# Patient Record
Sex: Female | Born: 1960 | Race: Black or African American | Hispanic: No | Marital: Single | Smoking: Never smoker
Health system: Southern US, Community
[De-identification: ages and names within clinical notes are randomized; demographics above are authoritative.]

## PROBLEM LIST (undated history)

## (undated) DIAGNOSIS — M1990 Unspecified osteoarthritis, unspecified site: Secondary | ICD-10-CM

## (undated) DIAGNOSIS — I10 Essential (primary) hypertension: Secondary | ICD-10-CM

## (undated) HISTORY — PX: ABDOMINAL HYSTERECTOMY: SHX81

---

## 2001-12-19 ENCOUNTER — Encounter: Admission: RE | Admit: 2001-12-19 | Discharge: 2001-12-19 | Payer: Self-pay | Admitting: *Deleted

## 2002-10-12 ENCOUNTER — Encounter: Admission: RE | Admit: 2002-10-12 | Discharge: 2002-10-12 | Payer: Self-pay | Admitting: Family Medicine

## 2002-10-12 ENCOUNTER — Encounter (INDEPENDENT_AMBULATORY_CARE_PROVIDER_SITE_OTHER): Payer: Self-pay | Admitting: Specialist

## 2003-02-22 ENCOUNTER — Encounter (INDEPENDENT_AMBULATORY_CARE_PROVIDER_SITE_OTHER): Payer: Self-pay | Admitting: *Deleted

## 2003-02-22 ENCOUNTER — Encounter (INDEPENDENT_AMBULATORY_CARE_PROVIDER_SITE_OTHER): Payer: Self-pay

## 2003-02-22 ENCOUNTER — Encounter: Admission: RE | Admit: 2003-02-22 | Discharge: 2003-02-22 | Payer: Self-pay | Admitting: Family Medicine

## 2003-02-22 ENCOUNTER — Other Ambulatory Visit: Admission: RE | Admit: 2003-02-22 | Discharge: 2003-02-22 | Payer: Self-pay | Admitting: Obstetrics and Gynecology

## 2003-03-01 ENCOUNTER — Ambulatory Visit (HOSPITAL_COMMUNITY): Admission: RE | Admit: 2003-03-01 | Discharge: 2003-03-01 | Payer: Self-pay | Admitting: Family Medicine

## 2004-01-14 ENCOUNTER — Encounter: Admission: RE | Admit: 2004-01-14 | Discharge: 2004-01-14 | Payer: Self-pay | Admitting: Obstetrics and Gynecology

## 2004-04-14 ENCOUNTER — Ambulatory Visit: Payer: Self-pay | Admitting: Obstetrics and Gynecology

## 2004-05-01 ENCOUNTER — Ambulatory Visit (HOSPITAL_COMMUNITY): Admission: RE | Admit: 2004-05-01 | Discharge: 2004-05-01 | Payer: Self-pay | Admitting: Obstetrics and Gynecology

## 2004-10-07 ENCOUNTER — Ambulatory Visit (HOSPITAL_COMMUNITY): Admission: RE | Admit: 2004-10-07 | Discharge: 2004-10-07 | Payer: Self-pay | Admitting: Obstetrics and Gynecology

## 2004-10-07 ENCOUNTER — Encounter (INDEPENDENT_AMBULATORY_CARE_PROVIDER_SITE_OTHER): Payer: Self-pay | Admitting: Specialist

## 2004-10-07 ENCOUNTER — Ambulatory Visit: Payer: Self-pay | Admitting: Obstetrics and Gynecology

## 2004-10-20 ENCOUNTER — Ambulatory Visit: Payer: Self-pay | Admitting: Obstetrics and Gynecology

## 2004-12-03 ENCOUNTER — Inpatient Hospital Stay (HOSPITAL_COMMUNITY): Admission: RE | Admit: 2004-12-03 | Discharge: 2004-12-05 | Payer: Self-pay | Admitting: Obstetrics and Gynecology

## 2004-12-03 ENCOUNTER — Encounter (INDEPENDENT_AMBULATORY_CARE_PROVIDER_SITE_OTHER): Payer: Self-pay | Admitting: *Deleted

## 2004-12-03 ENCOUNTER — Ambulatory Visit: Payer: Self-pay | Admitting: Obstetrics and Gynecology

## 2004-12-07 ENCOUNTER — Inpatient Hospital Stay (HOSPITAL_COMMUNITY): Admission: AD | Admit: 2004-12-07 | Discharge: 2004-12-07 | Payer: Self-pay | Admitting: *Deleted

## 2004-12-24 ENCOUNTER — Ambulatory Visit: Payer: Self-pay | Admitting: Obstetrics and Gynecology

## 2005-01-12 ENCOUNTER — Ambulatory Visit: Payer: Self-pay | Admitting: Obstetrics and Gynecology

## 2005-07-15 ENCOUNTER — Ambulatory Visit: Payer: Self-pay | Admitting: Obstetrics and Gynecology

## 2005-11-16 ENCOUNTER — Emergency Department (HOSPITAL_COMMUNITY): Admission: EM | Admit: 2005-11-16 | Discharge: 2005-11-16 | Payer: Self-pay | Admitting: Family Medicine

## 2007-05-09 ENCOUNTER — Emergency Department (HOSPITAL_COMMUNITY): Admission: EM | Admit: 2007-05-09 | Discharge: 2007-05-09 | Payer: Self-pay | Admitting: Emergency Medicine

## 2007-12-06 ENCOUNTER — Ambulatory Visit: Payer: Self-pay | Admitting: Obstetrics & Gynecology

## 2007-12-12 ENCOUNTER — Ambulatory Visit (HOSPITAL_COMMUNITY): Admission: RE | Admit: 2007-12-12 | Discharge: 2007-12-12 | Payer: Self-pay | Admitting: Obstetrics and Gynecology

## 2008-04-22 ENCOUNTER — Emergency Department (HOSPITAL_BASED_OUTPATIENT_CLINIC_OR_DEPARTMENT_OTHER): Admission: EM | Admit: 2008-04-22 | Discharge: 2008-04-22 | Payer: Self-pay | Admitting: Emergency Medicine

## 2008-12-11 ENCOUNTER — Ambulatory Visit: Payer: Self-pay | Admitting: Obstetrics and Gynecology

## 2008-12-11 LAB — CONVERTED CEMR LAB
Chlamydia, Swab/Urine, PCR: NEGATIVE
GC Probe Amp, Urine: NEGATIVE

## 2009-02-06 ENCOUNTER — Encounter: Admission: RE | Admit: 2009-02-06 | Discharge: 2009-02-06 | Payer: Self-pay | Admitting: Infectious Diseases

## 2009-04-01 ENCOUNTER — Emergency Department (HOSPITAL_BASED_OUTPATIENT_CLINIC_OR_DEPARTMENT_OTHER): Admission: EM | Admit: 2009-04-01 | Discharge: 2009-04-01 | Payer: Self-pay | Admitting: Emergency Medicine

## 2009-06-16 ENCOUNTER — Ambulatory Visit: Payer: Self-pay | Admitting: Family Medicine

## 2009-06-16 DIAGNOSIS — I10 Essential (primary) hypertension: Secondary | ICD-10-CM

## 2009-06-16 DIAGNOSIS — F17200 Nicotine dependence, unspecified, uncomplicated: Secondary | ICD-10-CM

## 2009-06-16 DIAGNOSIS — E669 Obesity, unspecified: Secondary | ICD-10-CM

## 2009-06-19 ENCOUNTER — Ambulatory Visit: Payer: Self-pay | Admitting: Family Medicine

## 2009-07-15 ENCOUNTER — Telehealth: Payer: Self-pay | Admitting: Family Medicine

## 2009-07-15 DIAGNOSIS — H5310 Unspecified subjective visual disturbances: Secondary | ICD-10-CM | POA: Insufficient documentation

## 2009-07-23 ENCOUNTER — Telehealth (INDEPENDENT_AMBULATORY_CARE_PROVIDER_SITE_OTHER): Payer: Self-pay | Admitting: Family Medicine

## 2009-08-14 ENCOUNTER — Encounter: Payer: Self-pay | Admitting: Family Medicine

## 2009-09-15 ENCOUNTER — Telehealth (INDEPENDENT_AMBULATORY_CARE_PROVIDER_SITE_OTHER): Payer: Self-pay | Admitting: Family Medicine

## 2009-09-20 ENCOUNTER — Ambulatory Visit: Payer: Self-pay | Admitting: Interventional Radiology

## 2009-09-20 ENCOUNTER — Emergency Department (HOSPITAL_BASED_OUTPATIENT_CLINIC_OR_DEPARTMENT_OTHER): Admission: EM | Admit: 2009-09-20 | Discharge: 2009-09-20 | Payer: Self-pay | Admitting: Emergency Medicine

## 2009-10-28 ENCOUNTER — Encounter: Payer: Self-pay | Admitting: Family Medicine

## 2009-10-28 ENCOUNTER — Ambulatory Visit: Payer: Self-pay | Admitting: Family Medicine

## 2009-10-28 DIAGNOSIS — R300 Dysuria: Secondary | ICD-10-CM

## 2009-10-28 LAB — CONVERTED CEMR LAB
Bilirubin Urine: NEGATIVE
Chlamydia, DNA Probe: NEGATIVE
GC Probe Amp, Genital: NEGATIVE
Glucose, Urine, Semiquant: NEGATIVE
Ketones, urine, test strip: NEGATIVE
Nitrite: NEGATIVE
Protein, U semiquant: NEGATIVE
Specific Gravity, Urine: 1.025
Urobilinogen, UA: 0.2
WBC Urine, dipstick: NEGATIVE
Whiff Test: NEGATIVE
pH: 5

## 2009-10-31 ENCOUNTER — Telehealth: Payer: Self-pay | Admitting: Family Medicine

## 2009-10-31 DIAGNOSIS — K029 Dental caries, unspecified: Secondary | ICD-10-CM | POA: Insufficient documentation

## 2009-11-04 ENCOUNTER — Encounter: Payer: Self-pay | Admitting: *Deleted

## 2009-12-10 ENCOUNTER — Ambulatory Visit: Payer: Self-pay | Admitting: Family Medicine

## 2009-12-10 ENCOUNTER — Encounter: Payer: Self-pay | Admitting: Family Medicine

## 2009-12-10 LAB — CONVERTED CEMR LAB
Chlamydia, DNA Probe: NEGATIVE
GC Probe Amp, Genital: NEGATIVE
Pap Smear: NEGATIVE
Whiff Test: NEGATIVE

## 2009-12-12 ENCOUNTER — Encounter: Payer: Self-pay | Admitting: Family Medicine

## 2009-12-12 ENCOUNTER — Ambulatory Visit: Payer: Self-pay | Admitting: Family Medicine

## 2009-12-12 LAB — CONVERTED CEMR LAB
BUN: 9 mg/dL (ref 6–23)
CO2: 26 meq/L (ref 19–32)
Calcium: 9.2 mg/dL (ref 8.4–10.5)
Chloride: 101 meq/L (ref 96–112)
Cholesterol: 170 mg/dL (ref 0–200)
Creatinine, Ser: 0.91 mg/dL (ref 0.40–1.20)
Glucose, Bld: 76 mg/dL (ref 70–99)
HDL: 36 mg/dL — ABNORMAL LOW (ref >39)
LDL Cholesterol: 124 mg/dL — ABNORMAL HIGH (ref 0–99)
Potassium: 4.5 meq/L (ref 3.5–5.3)
Sodium: 138 meq/L (ref 135–145)
Total CHOL/HDL Ratio: 4.7
Triglycerides: 49 mg/dL (ref <150)
VLDL: 10 mg/dL (ref 0–40)

## 2009-12-16 ENCOUNTER — Telehealth: Payer: Self-pay | Admitting: Family Medicine

## 2010-02-13 ENCOUNTER — Ambulatory Visit (HOSPITAL_COMMUNITY): Admission: RE | Admit: 2010-02-13 | Discharge: 2010-02-13 | Payer: Self-pay | Admitting: Family Medicine

## 2010-07-21 NOTE — Progress Notes (Signed)
Summary: phn msg  Phone Note Call from Patient Call back at [facility] Phone [phone]   Caller: Patient Summary of Call: Pt says that [facility] says they sent [place] the voucher for her eye glasses.  Did Dr. Lorelee Market recieve this voucher? Initial call taken by: Clydell Hakim,  September 15, 2009 9:58 AM  Follow-up for Phone Call        no, I have not seen any voucher. I can look in my box tomorrow, but I've not heard of them sending vouchers to [place].  Follow-up by: Jamie Brookes MD,  September 15, 2009 4:19 PM  Additional Follow-up for Phone Call Additional follow up Details #1::        Let pt know we did not have voucher. Additional Follow-up by: Clydell Hakim,  September 17, 2009 11:41 AM

## 2010-07-21 NOTE — Progress Notes (Signed)
Summary: Referral  Phone Note Call from Patient Call back at Home Phone [phone]   Reason for Call: Referral Summary of Call: pt has [employer] coverage and would like a referral to an eye doctor Initial call taken by: Knox Royalty,  July 15, 2009 10:25 AM  Follow-up for Phone Call        to PCP Follow-up by: Gladstone Pih,  July 15, 2009 10:37 AM  Additional Follow-up for Phone Call Additional follow up Details #1::        Pt does not have Diabetes and [employer] does not cover optometry services. If she needs glasses or an eye exam she will have to pay for them herself as far as I know. Do you know any different?  Additional Follow-up by: Jamie Brookes MD,  July 22, 2009 9:04 PM  New Problems: UNSPECIFIED SUBJECTIVE VISUAL DISTURBANCE (ICD-368.10)   Additional Follow-up for Phone Call Additional follow up Details #2::    Called pt to find out about ins, LEFT MESSAGE FOR HER TO CALL BACK.  If she has Jaynee Eagles we can do a [facility] referral for her eye exam.  Follow-up by: Gladstone Pih,  July 23, 2009 9:13 AM  Additional Follow-up for Phone Call Additional follow up Details #3:: Details for Additional Follow-up Action Taken: Spoke with Pt she has Jaynee Eagles and we have a copy of her card.  If you would put the referral in I will fill out the paper work and fax it to [facility].  Explained to pt that this is a voluter set up and unsure how long it will take for them to call her with an appt.  Additional Follow-up by: Gladstone Pih,  July 23, 2009 12:10 PM  New Problems: UNSPECIFIED SUBJECTIVE VISUAL DISTURBANCE (ICD-368.10)

## 2010-07-21 NOTE — Miscellaneous (Signed)
Summary: re: Dental referral/TS  Clinical Lists Changes called pt. pt has to pay out of pocket for cleaning. We do not refer pt's to the Dental Clinic w/out problems.Arlyss Repress CMA,  Nov 04, 2009 12:01 PM

## 2010-07-21 NOTE — Miscellaneous (Signed)
Summary: walk in: wants STD check  Clinical Lists Changes walk in asking for std screen including labs for HIV & syphlis. she is in a new relationship & her partner is being treated for something. they had sex saturday & the condom broke. states she has a itch in the vaginal area.   placed in work in as pcp is full. reminder her that she missed her CPP & needs to reschedule...Marland KitchenMarland KitchenGolden Circle RN  Oct 28, 2009 8:50 AM   Thanks for working my patient in. Jamie Brookes MD  Oct 28, 2009 9:14 AM

## 2010-07-21 NOTE — Assessment & Plan Note (Signed)
Summary: CPE/PAP, obestiy, smoking   Vital Signs:  Patient profile:   50 year old female Height:      61.25 inches Weight:      185 pounds BMI:     34.80 Temp:     98 degrees F Pulse rate:   82 / minute BP sitting:   128 / 79  Vitals Entered By: Golden Circle RN (December 10, 2009 2:06 PM)  Primary Care Provider:  Jamie Brookes MD  CC:  CPE, Smoking, and Obestiy.  History of Present Illness: CPE: Pt is here for a complete exam. Jackie Holden is not having any new concerns. Jackie Holden does want to be screened for high cholesterol and diabetes since this runs in her family. Jackie Holden has recently had unprotected sex with her partner. Jackie Holden would like to be tested again today.   Smoking Cessation: Pt smoking cigars 1/day x 11 years. Knows Jackie Holden needs to quit. Asked about getting yearly x-rays. Explained that if Jackie Holden gets an infection we will be happy to get her an x-ray and if there is something picked up on x-ray we will work it up, but we do not routinely x-ray people once a year just because they smoke.   Obestiy: Pt asked about something to boost her metabolism. Discussed exercising in the morning and eating freuqent smaller meals. Pt says Jackie Holden has been eating smaller meals every few hours and has lost a little weight. Jackie Holden has lost about 1 lb since december.      Habits & Providers  Alcohol-Tobacco-Diet     Alcohol drinks/day: 2     Alcohol type: wine     Tobacco Status: current     Tobacco Counseling: to quit use of tobacco products     Cigarette Packs/Day: <0.25     Other Tobacco cigar  Exercise-Depression-Behavior     Does Patient Exercise: yes     Type of exercise: walk     Times/week: 4     Drug Use: never     Seat Belt Use: always     Sun Exposure: infrequent  Comments: smokes 1 cigar a day  Current Medications (verified): 1)  None  Allergies (verified): No Known Drug Allergies  Past History:  Past Surgical History: Last updated: 07-03-09 hysterectomy for heavy bleeding,  2006 BTL in 2003  Family History: Last updated: 07-03-09 Dad deceased: 73 y/o, maybe stroke or MI  but not sure Mom deceased: 72 y/o, maybe stroke or MI, but not sure has 12 brothers and sisters, they raised her.  unknown health of siblings.   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 up to date on vaccines, Tetanus in the last 10 years (thinks it was 2003), last mammogram in June 2010, normal pap smears,    Social History: Drug Use:  never Sun Exposure-Excessive:  infrequent  Review of Systems        vitals reviewed and pertinent negatives and positives seen in HPI   Physical Exam  General:  Well-developed,well-nourished,in no acute distress; alert,appropriate and cooperative throughout examination Head:  Normocephalic and atraumatic without obvious abnormalities. No apparent alopecia or balding. Eyes:  No corneal or conjunctival inflammation noted. EOMI. Perrla. Vision grossly normal. Ears:  External ear exam shows no significant lesions or deformities.  Otoscopic examination reveals clear canals, tympanic membranes are  intact bilaterally without bulging, retraction, inflammation or discharge. Hearing is grossly normal bilaterally. Neck:  No deformities, masses, or tenderness noted. Breasts:  No mass, nodules, thickening, tenderness, bulging, retraction, inflamation, nipple discharge or skin changes noted.   Lungs:  Normal respiratory effort, chest expands symmetrically. Lungs are clear to auscultation, no crackles or wheezes. Heart:  Normal rate and regular rhythm. S1 and S2 normal without gallop, murmur, click, rub or other extra sounds. Abdomen:  Bowel sounds positive,abdomen soft and non-tender without masses, organomegaly or hernias noted. Genitalia:  Normal introitus for age, no external  lesions, no vaginal discharge, mucosa pink and moist, no vaginal, no vaginal atrophy, no friaility or hemorrhage, no uterus or cervix, vaginal cuff is normal, no adnexal masses or tenderness Extremities:  No clubbing, cyanosis, edema, or deformity noted with normal full range of motion of all joints.   Neurologic:  No cranial nerve deficits noted. Station and gait are normal. Plantar reflexes are down-going bilaterally. DTRs are symmetrical throughout. Sensory, motor and coordinative functions appear intact. Skin:  Intact without suspicious lesions or rashes Psych:  Cognition and judgment appear intact. Alert and cooperative with normal attention span and concentration. No apparent delusions, illusions, hallucinations   Impression & Recommendations:  Problem # 1:  HEALTH MAINTENANCE EXAM (ICD-V70.0) Assessment Unchanged Pt is doing well however Jackie Holden is still smoking about 1 cigar a day. Jackie Holden is not in school right now (has not been since Jan) stating that school is hard or her and Jackie Holden needed to take the semester off. Plans to return to school in the fall. Wants to complete childcare classes.   Orders: [facility] - Est  40-64 yrs (04540)  Problem # 2:  SCREENING FOR MALIGNANT NEOPLASM OF THE CERVIX (ICD-V76.2) Assessment: Unchanged Pt has no abnormal findings on exam. Pt has had unprotected sex in the last few weeks and would like to be retested for STI's.   Orders: Pap Smear-[facility] ([id number]) [facility] - Est  40-64 yrs (95621)  Problem # 3:  OTHER SCREENING MAMMOGRAM (ICD-V76.12) Assessment: Comment Only Pt needs to get a mammogram. Jackie Holden was given info and a referral written for the mammogram to be done at the hospital.   Orders: Mammogram (Mammogram) [facility] - Est  40-64 yrs [phone])  Other Orders: GC/Chlamydia-[facility] ([id number]) Wet Prep- [facility] ([id number]) GC/Chlamydia-[facility] ([id number]) Future Orders: Basic Met-[facility] ([id number]) ... 12/12/2009 Lipid-[facility] ([id number]) ... 12/12/2009  Patient  Instructions: 1)  It was nice to see you today.  2)  [facility]. We will give you a form with instructions and the number. 3)  I will call you with any abnormal results.  4)  Consider stopping the cigar smoking or decreasing it to 3 times a week to start with and then stopping.   Laboratory Results  Date/Time Received: December 10, 2009 2:44 PM  Date/Time Reported: December 10, 2009 3:06 PM   [employer] Source: vag WBC/hpf: <5 Bacteria/hpf: 3+  Rods Clue cells/hpf: none  Negative whiff Yeast/hpf: none Trichomonas/hpf: none Comments: ...............test performed by......Marland KitchenBonnie A. [place], MLS (ASCP)cm

## 2010-07-21 NOTE — Progress Notes (Signed)
Summary: phn msg  Phone Note Call from Patient Call back at [facility] Phone [phone]   Caller: Patient Summary of Call: pt is returning call to [facility] Initial call taken by: De Nurse,  July 23, 2009 11:23 AM  Follow-up for Phone Call        returned call see previous phone message Follow-up by: Gladstone Pih,  July 23, 2009 12:15 PM

## 2010-07-21 NOTE — Consult Note (Signed)
Summary: [facility] : Pt has some non-pathologic scaring, return 2 yrs  [facility]   Imported By: Clydell Hakim 08/22/2009 16:24:12  _____________________________________________________________________  External Attachment:    Type:   Image     Comment:   External Document

## 2010-07-21 NOTE — Assessment & Plan Note (Signed)
Summary: std check/[employer]/strother   Vital Signs:  Patient profile:   50 year old female Height:      61 inches Weight:      183.4 pounds BMI:     34.78 Temp:     98.1 degrees F oral Pulse rate:   85 / minute BP sitting:   141 / 85  (left arm) Cuff size:   regular  Vitals Entered By: Garen Grams LPN (Oct 28, 2009 8:55 AM) CC: std check Is Patient Diabetic? No Pain Assessment Patient in pain? no        CC:  std check.  History of Present Illness: 1. STD check:  Pt was recently checked for STDs for [facility] in April because she was complaining of some discharge and back pain.  She was diagnosed with Trich and received treatment.  She also got some medication for her partner which he had been taking.  They had sex on Saturday and the condom broke.  She wants to check and see if she was reinfected.  She is having some vaginal burning and itching but no discharge.  She endorses very mild dysuria.      ROS: denies fevers, abdominal pain  Habits & Providers  Alcohol-Tobacco-Diet     Tobacco Status: current     Tobacco Counseling: to quit use of tobacco products     Cigarette Packs/Day: <0.25  Current Medications (verified): 1)  None  Allergies: No Known Drug Allergies  Social History: Reviewed history from 06/16/2009 and no changes required. 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 up to date on vaccines, Tetanus in the last 10 years (thinks it was 2003), last mammogram in June 2010, normal pap smears,  Packs/Day:  <0.25  Physical Exam  General:  Well-developed,well-nourished,in no acute distress; alert,appropriate and cooperative throughout examination Lungs:  normal respiratory effort.  normal respiratory effort.   Heart:  normal rate and regular rhythm.  normal rate and regular rhythm.   Abdomen:  soft, non-tender, and normal bowel  sounds.  soft, non-tender, and normal bowel sounds.   Genitalia:  normal introitus, no external lesions, and no vaginal discharge.  normal introitus, no external lesions, and no vaginal discharge.  normal introitus, no external lesions, no vaginal discharge, mucosa pink and moist, and no vaginal or cervical lesions.   Skin:  no rashes and no suspicious lesions.     Impression & Recommendations:  Problem # 1:  CONTACT OR EXPOSURE TO OTHER VIRAL DISEASES (ICD-V01.79) Will check for the following orders. Orders: GC/Chlamydia-[facility] ([id number]) [facility] [phone]) HIV-[facility] ([id number]) RPR-[facility] [phone]) [facility]- Est Level  3 (13086)  Problem # 2:  DYSURIA (ICD-788.1) Check UA Orders: Urinalysis-[facility] (00000)  Patient Instructions: 1)  The test for Trich or a bladder infection was negative 2)  We will let you know of the other results  Laboratory Results   Urine Tests  Date/Time Received: Oct 28, 2009 9:14 AM  Date/Time Reported: Oct 28, 2009 9:39 AM   Routine Urinalysis   Color: yellow Appearance: Clear Glucose: negative   (Normal Range: Negative) Bilirubin: negative   (Normal Range: Negative) Ketone: negative   (Normal Range: Negative) Spec. Gravity: 1.025   (Normal Range: 1.003-1.035) Blood: trace-intact   (Normal Range: Negative) pH: 5.0   (Normal Range: 5.0-8.0) Protein: negative   (Normal Range: Negative) Urobilinogen: 0.2   (  Normal Range: 0-1) Nitrite: negative   (Normal Range: Negative) Leukocyte Esterace: negative   (Normal Range: Negative)  Urine Microscopic WBC/HPF: occ RBC/HPF: 1-5 Bacteria/HPF: 1+ Mucous/HPF: 2+ Epithelial/HPF: 1-5    Comments: ...............test performed by......Marland KitchenBonnie A. [place], MLS (ASCP)cm  Date/Time Received: Oct 28, 2009 9:08 AM  Date/Time Reported: Oct 28, 2009 9:17 AM   [employer] Source: vaginal WBC/hpf: 0-3 Bacteria/hpf: 3+  Rods Clue cells/hpf: none  Negative whiff Yeast/hpf: none Trichomonas/hpf:  none Comments: ...........test performed by...........Marland KitchenTerese Door, CMA

## 2010-07-21 NOTE — Progress Notes (Signed)
Summary: referral  Phone Note Call from Patient Call back at Home Phone [phone]   Caller: Patient Summary of Call: needs to be referred to Dental Clinic for a cleaning. Initial call taken by: De Nurse,  Oct 31, 2009 1:48 PM  Follow-up for Phone Call        will forward to MD. Follow-up by: Theresia Lo RN,  Oct 31, 2009 2:22 PM  New Problems: POTENTIAL CARIES (ICD-521.00)   New Problems: POTENTIAL CARIES (ICD-521.00)

## 2010-07-21 NOTE — Progress Notes (Signed)
Summary: left message about labs and cholesterol  Left VM and let the patient know what her results are. She is suppose to call and let us know if she wants to start a medicine to decrease her bad cholesterol or if she wants to repeat a fasting lipid panel in 2-3 months to see if her lifestyle changes will improve her cholesterol.  Jamie Brookes MD  December 16, 2009 1:29 PM   Ways she can decrease her bad cholesterol is by eating more vegetables and less animal products (including but not limited to butter, eggs, meat, milk, etc...  basically anything that had a mom. ) Jamie Brookes MD  December 16, 2009 1:30 PM

## 2010-08-03 ENCOUNTER — Emergency Department (HOSPITAL_BASED_OUTPATIENT_CLINIC_OR_DEPARTMENT_OTHER)
Admission: EM | Admit: 2010-08-03 | Discharge: 2010-08-03 | Disposition: A | Discharge status: Home / Self Care | Payer: Worker's Compensation | Attending: Emergency Medicine | Admitting: Emergency Medicine

## 2010-08-03 DIAGNOSIS — S61209A Unspecified open wound of unspecified finger without damage to nail, initial encounter: Secondary | ICD-10-CM | POA: Insufficient documentation

## 2010-08-03 DIAGNOSIS — Y9289 Other specified places as the place of occurrence of the external cause: Secondary | ICD-10-CM | POA: Insufficient documentation

## 2010-08-03 DIAGNOSIS — F17200 Nicotine dependence, unspecified, uncomplicated: Secondary | ICD-10-CM | POA: Insufficient documentation

## 2010-08-03 DIAGNOSIS — W268XXA Contact with other sharp object(s), not elsewhere classified, initial encounter: Secondary | ICD-10-CM | POA: Insufficient documentation

## 2010-08-13 ENCOUNTER — Emergency Department (HOSPITAL_BASED_OUTPATIENT_CLINIC_OR_DEPARTMENT_OTHER)
Admission: EM | Admit: 2010-08-13 | Discharge: 2010-08-13 | Disposition: A | Discharge status: Home / Self Care | Payer: Worker's Compensation | Attending: Emergency Medicine | Admitting: Emergency Medicine

## 2010-08-13 DIAGNOSIS — Z4802 Encounter for removal of sutures: Secondary | ICD-10-CM | POA: Insufficient documentation

## 2010-09-06 ENCOUNTER — Inpatient Hospital Stay (INDEPENDENT_AMBULATORY_CARE_PROVIDER_SITE_OTHER)
Admission: RE | Admit: 2010-09-06 | Discharge: 2010-09-06 | Disposition: A | Discharge status: Home / Self Care | Payer: BC Managed Care – PPO | Source: Ambulatory Visit | Attending: Family Medicine | Admitting: Family Medicine

## 2010-09-06 DIAGNOSIS — M545 Low back pain: Secondary | ICD-10-CM

## 2010-09-06 DIAGNOSIS — N760 Acute vaginitis: Secondary | ICD-10-CM

## 2010-09-06 LAB — WET PREP, GENITAL
Trich, Wet Prep: NONE SEEN
WBC, Wet Prep HPF POC: NONE SEEN
Yeast Wet Prep HPF POC: NONE SEEN

## 2010-09-06 LAB — POCT URINALYSIS DIP (DEVICE)
Bilirubin Urine: NEGATIVE
Hgb urine dipstick: NEGATIVE
Protein, ur: NEGATIVE mg/dL
Specific Gravity, Urine: 1.025 (ref 1.005–1.030)
Urobilinogen, UA: 0.2 mg/dL (ref 0.0–1.0)

## 2010-09-09 LAB — URINE MICROSCOPIC-ADD ON

## 2010-09-09 LAB — URINALYSIS, ROUTINE W REFLEX MICROSCOPIC
Bilirubin Urine: NEGATIVE
Glucose, UA: NEGATIVE mg/dL
Ketones, ur: NEGATIVE mg/dL
Nitrite: NEGATIVE
Protein, ur: NEGATIVE mg/dL
Specific Gravity, Urine: 1.022 (ref 1.005–1.030)
Urobilinogen, UA: 0.2 mg/dL (ref 0.0–1.0)
pH: 6 (ref 5.0–8.0)

## 2010-09-09 LAB — WET PREP, GENITAL: Yeast Wet Prep HPF POC: NONE SEEN

## 2010-09-09 LAB — GC/CHLAMYDIA PROBE AMP, GENITAL
Chlamydia, DNA Probe: NEGATIVE
GC Probe Amp, Genital: NEGATIVE

## 2010-09-24 LAB — WOUND CULTURE: Gram Stain: NONE SEEN

## 2010-11-03 NOTE — Group Therapy Note (Signed)
NAMELaquitta, [name]             ACCOUNT NO.:  [id number]   MEDICAL RECORD NO.:  [id number]          PATIENT TYPE:  WOC   LOCATION:  [facility]                   FACILITY:  [facility]   PHYSICIAN:  Johnella Moloney, MD        DATE OF BIRTH:  12-Sep-1960   DATE OF SERVICE:  12/06/2007                                  CLINIC NOTE   CHIEF COMPLAINT:  Annual examination.   HISTORY OF PRESENT ILLNESS:  The patient is a 50 year old nulliparous  African-American female who underwent a total abdominal hysterectomy in  2006 for chronic menometrorrhagia, severe dysmenorrhea, and fibroids.  The patient is here for her annual examination.  She denies any bleeding  since her procedure, and also denies any problems with intercourse or  any other GYN concerns.  The patient is interested in screening for  sexually transmitted infections.   PAST OB/GYN HISTORY:  The patient is nulliparous.  She is currently  status post total abdominal hysterectomy.  She has had no bleeding since  her procedure, and no menopausal symptoms.  Her last Pap smear was prior  to the hysterectomy, but she has never had any history of abnormal Pap  smears, and does not need cervical cancer screening at this point.   PAST MEDICAL HISTORY:  None.   PAST SURGICAL HISTORY:  1. Total abdominal hysterectomy.  2. Bilateral tubal ligation.   MEDICATIONS:  None.   ALLERGIES:  No known drug allergies.   SOCIAL HISTORY:  The patient lives alone.  She smokes 1 cigar per day,  and she has been smoking for the past 15 years.  She drinks 2-3 drinks a  week.  No IV drug use or illicit drug use.  The patient denies any  current or past history of physical or sexual abuse.   FAMILY HISTORY:  Remarkable for hypertension and diabetes.  No cancer.   REVIEW OF SYSTEMS:  A comprehensive review of systems was negative.   PHYSICAL EXAMINATION:  VITAL SIGNS:  Temperature 98.8, pulse 74, blood  pressure was 164/108, and on a recheck was 151/99,  respirations 20,  weight 201.7 pounds, height 5 feet 1 inch.  GENERAL:  A well-developed, well-nourished, black female in no apparent  acute distress.  HEENT:  Within normal limits.  NECK:  Supple with no masses, no thyromegaly.  LUNGS:  Clear to auscultation bilaterally.  HEART:  Regular rate and rhythm.  No abnormal heart sounds.  BREASTS:  Symmetrical with no abnormal masses, skin changes or drainage.  No lymphadenopathy and nontender.  ABDOMEN:  Soft and nontender, nondistended.  No organomegaly or masses  noted.  Well-healed Pfannenstiel incision.  PELVIC:  Normal external female genitalia.  Vagina was pink and well  rugated with a copious amount of thick white discharge noted.  A sample  was obtained for wet prep.  Vaginal cuff is well-healed.  No lesions  noted.  No abnormal masses palpated on bimanual exam.  RECTAL:  Normal exteriorly.  EXTREMITIES:  No clubbing, cyanosis or edema.   ASSESSMENT AND PLAN:  The patient is a 50 year old female status post  TAH in 2006 here for annual  exam.  The patient had a normal breast  examination, and has a mammogram that is booked for December 12, 2007.  The  patient does not need any dysplasia screening given that her  hysterectomy was done for benign indications; and does not have any risk  factors for the acquisition of HPV, at this point.  As for her sexually  transmitted disease screen, the patient will have a urine, GC, and  chlamydia check; and will have a blood draw for HIV, Hep B, Hep C, and  RPR check.  The patient was told to followup with her primary doctor  given her elevated blood pressure at this visit as she could have a  diagnosis of hypertension and needs follow up and management for this  issue.           ______________________________  Johnella Moloney, MD     UD/MEDQ  D:  12/06/2007  T:  12/06/2007  Job:  [id number]

## 2010-11-03 NOTE — Group Therapy Note (Signed)
NAMEJakaila, Jackie Holden             ACCOUNT NO.:  [id number]   MEDICAL RECORD NO.:  [id number]          PATIENT TYPE:  WOC   LOCATION:  [facility]                   FACILITY:  [facility]   PHYSICIAN:  Argentina Donovan, MD        DATE OF BIRTH:  [birth year]   DATE OF SERVICE:  12/11/2008                                  CLINIC NOTE   HISTORY OF PRESENT ILLNESS:  This is a 50 year old African American  female, G1, P0-0-1-0, with a significant past medical history of a total  abdominal hysterectomy in 2006 for chronic menometrorrhagia, severe  dysmenorrhea, and fibroids.  The patient presents for her annual  gynecological examination.  At this time, she denies any vaginal  discharge, itchiness, bleeding.  She denies any change in her sexual  history, as she is still with the same partner that she has had for  several years.  She denies any pain with intercourse but is interested  in screening for sexually transmitted diseases.   PAST OB HISTORY/GYNECOLOGICAL:  Patient is nulliparous.  She had a total  abdominal hysterectomy in 2006 and bilateral tubal ligation.  Her last  Pap smear was prior to her hysterectomy.   PAST MEDICAL HISTORY:  None reported.  Patient does not see a primary  care Will Schier.   PAST SURGICAL HISTORY:  1. Total abdominal hysterectomy.  2. Bilateral tubal ligation.   MEDICATIONS:  None.   ALLERGIES:  No known drug allergies.   SOCIAL HISTORY:  Patient reportedly lives alone.  She smokes 1 cigar per  day and is attempting to cut down.  She reports she drinks less than 2  to 3 drinks a week.  She denies any IV drug use.  She denies any past or  current physical or sexual abuse.  Patient still has the same sexual  partner she has had for the past several years.   FAMILY HISTORY:  Significant for hypertension, diabetes, but negative  for cancer.   REVIEW OF SYSTEMS:  GENERAL:  Patient denies any fatigue, weight loss,  or weight gain.  LUNGS:  She denies any shortness of  breath, hemoptysis,  cough, or wheezing.  CARDIAC:  She denies any angina, chest pain,  hypertension, dyspnea on exertion, or palpitations.  GI:  She denies any  nausea, vomiting, diarrhea, or bloody stool.  URINARY:  She denies any  frequency, hesitancy, or urgency.  GENITAL:  She denies any vaginal  discharge or dyspareunia but does complain of a small mass or sore on  her right vaginal area.   PHYSICAL EXAMINATION:  GENERAL:  A 50 year old [place] American female  in a good state of health with good hygiene in no apparent distress.  VITAL SIGNS:  Temperature 97.5, pulse 60, blood pressure 142/80,  respirations 16, weight 194.  Height 61 inches.  BREASTS:  Bilateral breasts symmetrical with a small pea-sized, moveable  mass on her left medial breast, otherwise no other masses noted  bilaterally with no nipple discharge.  No tenderness masses present.  LUNGS:  Clear to auscultation bilaterally.  No wheezes, crackles, or  rhonchi.  CARDIAC:  Regular rate and rhythm.  Good S1 and S2.  No murmurs, rubs or  gallops.  ABDOMEN:  Nondistended.  Soft.  Active bowel sounds.  No tenderness to  palpation.  GENITAL:  External genitalia intact but with a small 1 cm slightly  reddened mass on her right labia majora without discharge or pain  reported on palpation.  Vaginal mucosa intact.  No signs of lesions or  discharge.  Cervix without signs of inflammation or discharge.  Bimanual  examination did not reveal any adnexal masses.  Patient did not require  any Pap smear screening due to the fact that she has received a total  abdominal hysterectomy.  GC/Chlamydia testing will be performed from a  urine sample.   ASSESSMENT/PLAN:  This is a 50 year old African American female with a  history of a total abdominal hysterectomy in 2006 here for her annual  exam.  Breasts exam reveals a small, pea-sized mass on her left medial  breast that the patient is instructed monitor for any changes.   Otherwise, breast exam was unremarkable.  Patient also has a small 1 cm,  reddened mass on her right labia majora that does not express any fluid  upon pressure and is not painful.  Patient has been instructed to apply  warm compresses to the area and monitor for any change if these lesions  continue to grow or if it does not resolve.  Patient is instructed to  return to the clinic for further followup of this.  Patient does not  need any cervical dysplasia screening, given the hysterectomy that was  done in 2006.  Screening for sexually transmitted diseases will be  performed via a urine sample and will be checked for GC and Chlamydia.  In addition, patient is going to get RPR screening for syphilis to rule  out this as a source of lesion on her right genital area.  All results  will be communicated to the patient, and the patient has been instructed  that if she has any other gynecological complaints, to return to the  clinic, and these will be addressed.  In addition, she was also  encouraged to follow up or establish a primary care physician for  followup of her mildly elevated blood pressures in addition to the need  to receive a good annual physical.  The patient verbalized comprehension  of these instructions but reported difficulty due to lack of insurance.  Patient will be given recommendations to seek care, given her insurance  difficulties.      Sid Falcon, CNM    ______________________________  Argentina Donovan, MD    WM/MEDQ  D:  12/11/2008  T:  12/11/2008  Job:  [id number]

## 2010-11-03 NOTE — Group Therapy Note (Signed)
NAMEHonor Holden, Jackie Holden             ACCOUNT NO.:  [id number]   MEDICAL RECORD NO.:  [id number]          PATIENT TYPE:  WOC   LOCATION:  [facility]                   FACILITY:  [facility]   PHYSICIAN:  Argentina Donovan, MD        DATE OF BIRTH:  1960-09-17   DATE OF SERVICE:                                  CLINIC NOTE   ADDENDUM:  Ms. Weesner was instructed to set up a yearly mammogram at the  health department, as she is due for her mammogram.  In addition, this  will help monitor the small left breast mass that was noted on  examination.      Sid Falcon, CNM    ______________________________  Argentina Donovan, MD    WM/MEDQ  D:  12/11/2008  T:  12/11/2008  Job:  [id number]

## 2010-11-06 NOTE — Group Therapy Note (Signed)
NAMETambria, Pfannenstiel Holden             ACCOUNT NO.:  [id number]   MEDICAL RECORD NO.:  [id number]          PATIENT TYPE:  WOC   LOCATION:  [facility]                   FACILITY:  [facility]   PHYSICIAN:  Argentina Donovan, MD        DATE OF BIRTH:  1960-08-24   DATE OF SERVICE:  07/15/2005                                    CLINIC NOTE   CHIEF COMPLAINT:  Six-month follow-up for total abdominal hysterectomy.   Patient is a 50 year old African-American female who was seen in  consultation.  At this point back in June of 2006 patient had presented with  severe dysmenorrhea and chronic menometrorrhagia.  Ultrasound had shown  multiple uterine fibroids.  The decision was made to elect to undergo a  total abdominal hysterectomy.  Patient underwent the procedure in June of  2006.  Tolerated the procedure fine without complications.  Pathologic  specimen shows fibroids, but no evidence of malignancy or cancer and  secretory endometrium, but no evidence of hyperplasia.   Patient presents today for Pap smear.   Vital signs are noted.   Physical examination was deferred.   ASSESSMENT/PLAN:  Patient is a 50 year old with menometrorrhagia secondary  to fibroids.  Patient underwent a total abdominal hysterectomy.  Has no  evidence of abnormal Pap smears in the past and normal pathology on her  hysterectomy.  She does not need a Pap smear today and will only need p.r.n.  follow-up thereafter.     ______________________________  Tanya Nones, M.D.    ______________________________  Argentina Donovan, MD    /MEDQ  D:  07/15/2005  T:  07/16/2005  Job:  [id number]

## 2010-11-06 NOTE — Group Therapy Note (Signed)
NAMEAnnalie, Wenner Makinley             ACCOUNT NO.:  [id number]   MEDICAL RECORD NO.:  [id number]          PATIENT TYPE:  WOC   LOCATION:  [facility]                   [facility]ACILITY:  [facility]   PHYSICIAN:  Argentina Donovan, MD        DATE OF BIRTH:  [birth year]   DATE OF SERVICE:  10/20/2004                                    CLINIC NOTE   The patient is a 50 year old nulliparous black female who has had a tubal  ligation, has severe dysmenorrhea with chronic menometrorrhagia.  Underwent  D&C but we could not do the hysteroscopy and pass it beyond 5 cm mark  because of a leiomyomata submucous.  Patient is anxious to have a  hysterectomy.  We are going to go ahead and schedule for vaginal  hysterectomy, possible abdominal for chronic pelvic pain, fibroid uterus.  She desires to be scheduled on June 8 if possible because of her work and we  will try and accommodate her.   DIAGNOSES:  Chronic intractable pelvic pain and menometrorrhagia.      PR/MEDQ  D:  10/20/2004  T:  10/21/2004  Job:  [id number]

## 2010-11-06 NOTE — Op Note (Signed)
NAME:  Jackie Holden, Jackie Holden             ACCOUNT NO.:  [id number]   MEDICAL RECORD NO.:  [id number]          PATIENT TYPE:  AMB   LOCATION:  [facility]                           [facility]   PHYSICIAN:  Phil D. Okey Dupre, M.D.     DATE OF BIRTH:  01-13-1961   DATE OF PROCEDURE:  10/07/2004  DATE OF DISCHARGE:                                 OPERATIVE REPORT   PROCEDURE:  Dilatation and curettage, attempted hysteroscopy.   PREOPERATIVE DIAGNOSES:  1. Intractable menometrorrhagia.  2. Fibroids.     POSTOPERATIVE DIAGNOSES:  1. Intractable menometrorrhagia.  2. Fibroids.     SURGEON:  Javier Glazier. Rose, M.D.   ESTIMATED BLOOD LOSS:  100 mL.   ANESTHESIA:  General.   SPECIMENS TO PATHOLOGY:  Endometrial curettings.   OPERATIVE FINDINGS:  On bimanual examination, there were multiple  pedunculated and intramural leiomyoma palpated within the uterine wall.  There seemed to be a significant-size fibroid at the lower uterine segment  anteriorly,  and the uterus was about a 10-week gestational size and in the  endometrium, although the uterus could be sounded to a depth of 11 cm, once  was dilated the hysteroscope could not be passed more than 5 cm into the  uterine cavity.  On curettage it seemed that there was a large left lateral  fibroid that blocked the opening for anything larger than the uterine sound  to enter.  A small serrated curette was used and a small stone forceps could  get to the fundus but not the hysteroscope.   the procedure went as follows:  Under satisfactory general anesthesia, the  patient in dorsal lithotomy position, the perineum and vagina were  prepped  and draped in usual sterile manner.  Bimanual pelvic examination as above.  A weighted speculum was placed at the posterior fourchette of the vagina and  the anterior lip of the  cervix grasped with a single-tooth tenaculum.  The  uterine cavity sounded to depth of 11 cm, cervical os was dilated to a #7  Hegar dilator,  and the 30 degree hysteroscope was attempted to be inserted  but could not be inserted beyond 5-6 cm into the uterus.  It was resounded  to make sure that we were going in the right direction, and a small serrated  curette could be put in.  Curettage was done.  The irregularities in the  lining of the uterus were obvious with the submucous fibroids, which did not  allow for our planned endometrial ablation with NovaSure.  The procedure was  therefore aborted at that point.  The tenaculum and speculum were removed  from the  vagina.  Prior to the procedure, 10 mL of 1% Xylocaine was  injected in each of the lateral paracervical areas for additional comfort to  the patient.  The patient tolerated the procedure well and was transferred  to the recovery room in satisfactory condition. __________ in two weeks and  suggest an hysterectomy as I think a vaginal hysterectomy would be  __________ on multiple exterior leiomyoma.      PDR/MEDQ  D:  10/07/2004  T:  10/07/2004  Job:  [id number]

## 2010-11-06 NOTE — H&P (Signed)
NAMEShamarie, Jackie Holden             ACCOUNT NO.:  [id number]   MEDICAL RECORD NO.:  [id number]           PATIENT TYPE:   LOCATION:                                 FACILITY:   PHYSICIAN:  Phil D. Okey Dupre, M.D.          DATE OF BIRTH:   DATE OF ADMISSION:  12/03/2004  DATE OF DISCHARGE:                                HISTORY & PHYSICAL   CHIEF COMPLAINT:  Severe dysmenorrhea with chronic menometrorrhagia  sometimes lasting 30 days.   HISTORY OF PRESENT ILLNESS:  The patient is a 50 year old nulliparous black  female with history of a tubal ligation.  Has had severe dysmenorrhea with  chronic menometrorrhagia.  Underwent a D&C, but could not pass the  hysteroscope beyond the 5 cm mark because of the submucous leiomyomata.  We  are going to try and do this vaginally but the patient understands because  of her nulliparous status and the larger fibroids it may be necessary to go  abdominally.   PAST MEDICAL HISTORY:  Benign.  She has never had any surgery except for the  D&C.  She is on no medications on a regular basis.  Has no medical  allergies.   SOCIAL HISTORY:  She does not smoke, drink alcohol, or take illicit drugs.   FAMILY HISTORY:  Hypertension and diabetes.  No bleeding problems.   REVIEW OF SYSTEMS:  Negative with the exception of the present illness.   PHYSICAL EXAMINATION:  VITAL SIGNS:  Blood pressure 132/89, pulse 86,  temperature 97.9, respirations 18 per minute.  Weight 191 pounds, height 5  feet 1 inch.  GENERAL:  Well-developed, well-nourished black female in no acute distress.  HEENT:  Within normal limits.  NECK:  Supple with no masses.  Thyroid is symmetrical with no dominant  masses.  LUNGS:  Clear to auscultation/percussion.  HEART:  No murmur.  Normal sinus rhythm.  PMI fifth intracostal space in mid  clavicular line.  BREASTS:  Symmetrical with no dominant masses.  ABDOMEN:  Soft, flat, nontender.  No masses are noted.  No organomegaly.  GENITALIA:   External genitalia was normal.  Vagina was clean and well  rugated.  The cervix is clean and regular.  Uterus was anterior and somewhat  irregular in configuration.  Adnexa could not be palpated.  RECTAL:  Confirmatory and no masses.  EXTREMITIES:  No edema.  No varicosities.  DTRs within normal limits.  Skin  is normal turgor.   IMPRESSION:  Patient with chronic menometrorrhagia thought secondary to  leiomyomata uteri and dysmenorrhea.   PLAN:  Total vaginal hysterectomy, if not possible, abdominal hysterectomy.       PDR/MEDQ  D:  12/01/2004  T:  12/01/2004  Job:  [id number]

## 2010-11-06 NOTE — Group Therapy Note (Signed)
Jackie Holden, Jackie Holden             ACCOUNT NO.:  [id number]   MEDICAL RECORD NO.:  [id number]          PATIENT TYPE:  WOC   LOCATION:  [facility]                   FACILITY:  [facility]   PHYSICIAN:  Argentina Donovan, MD        DATE OF BIRTH:  1961-05-10   DATE OF SERVICE:                                    CLINIC NOTE   DATE OF VISIT:  April 14, 2004.   CLINIC NOTE:  Patient is a 50 year old black female, nulligravida, who  underwent tubal ligation three to four years ago and since that time has  been bleeding she says about 20 days a month for 7 to 10 days and then stops  and restarts for 7 to 10 days.  In discussing with her, it sounds as if she  needs to be on oral contraceptives to control this; however, it will have to  be evaluated.  On a digital examination, the uterus could not be well-  outlined because of habitus of the patient.  A Pap smear was taken.  The  external genitalia is normal, BUS within normal limits, the vagina is clean,  redundant, and well-rugated with a nulliparous cervix, and the adnexa and  uterus could not be well outlined.  We are going to get an ultrasound and  CBC on the patient.  If all of those look reasonably normal, I would cycle  her on oral contraceptives.   IMPRESSION:  1.  Menometrorrhagia.  2.  Probable dysfunctional uterine bleeding.      PR/MEDQ  D:  04/14/2004  T:  04/14/2004  Job:  [id number]

## 2010-11-06 NOTE — Discharge Summary (Signed)
NAMEWinefred, [name]             ACCOUNT NO.:  [id number]   MEDICAL RECORD NO.:  [id number]          PATIENT TYPE:  INP   LOCATION:  9307                          [facility]   PHYSICIAN:  Phil D. Okey Dupre, M.D.     DATE OF BIRTH:  03-Apr-1961   DATE OF ADMISSION:  12/03/2004  DATE OF DISCHARGE:  12/05/2004                                 DISCHARGE SUMMARY   The patient is a 50 year old black female who underwent total abdominal  hysterectomy after attempt at vaginal hysterectomy on the day of admission.  The patient had a significant blood loss of about 800 mL during surgery and  at discharge has a hemoglobin of 8.5 with hematocrit of 26.9.  She has had  an afebrile hospital course.   On physical examination before discharge, the patient's lungs are clear to  auscultation and percussion.  She has no murmur, normal sinus rhythm.  Abdomen is soft, flat, with active bowel sounds.  Normally tender around the  incision site but no sign of erythema or infection.  Extremities are  negative.  No genital bleeding.  No CVA tenderness.  The patient will return  in 2 days for wound staple removal to the MAU and will be seen after that in  follow up in 2 weeks.  She has been given instructions as to activity,  follow up, and diet.  The pathology report is still pending.  The patient  will be discharged on Percocet and Motrin for pain and Hemocyte for iron  supplementation.   DISCHARGE DIAGNOSES:  Pending pathology report is symptomatic leiomyomata,  satisfactory status post abdominal hysterectomy.       PDR/MEDQ  D:  12/05/2004  T:  12/05/2004  Job:  [id number]

## 2010-11-06 NOTE — Group Therapy Note (Signed)
NAME:  Jackie Holden, Jackie Holden                       ACCOUNT NO.:  [id number]   MEDICAL RECORD NO.:  [id number]                   PATIENT TYPE:  OUT   LOCATION:  [facility]                           FACILITY:  [facility]   PHYSICIAN:  Tinnie Gens, MD                     DATE OF BIRTH:  1961-04-08   DATE OF SERVICE:  02/22/2003                                    CLINIC NOTE   CHIEF COMPLAINT:  Repeat Pap smear, abnormal bleeding, and foul-smelling  discharge.   HISTORY OF PRESENT ILLNESS:  The patient is a 50 year old nulliparous  patient who is status post BTL who comes in for a repeat Pap.  Apparently  she had a Pap that showed ASCUS in April 2004 and is here for six-month  repeat.  Additionally, she has noticed that over the past several months  that she has had two periods a month.  Thi is very new for her as her  periods have always been regular.  Her bleeding seems to be heavier and she  seems to be spotting all the time.  She has had no change in medication.   She also complains of heavy discharge with smelly odor that she notices with  and without intercourse.  She notes no vaginal irritation.   She is also complaining of a rash in her intertriginous area of the right  thigh.  She equates it to working out and swimming in the water, getting in  the sauna.   The patient has complained of anorgasmia for the last several months.  She  reports that she is unable to achieve climax with her partner.  She does not  do any self stimulation.   PHYSICAL EXAMINATION:  VITAL SIGNS:  Blood pressure is 131/83, weight is  191.7.  GENERAL:  She is a moderately-obese black female in no acute distress.  PELVIC:  She has normal external female genitalia.  The vagina is rugated  with bloody discharge.  The cervix is visualized easily.  A Pap smear is  obtained and a wet prep.  Following this the cervix is scrubbed with  Betadine and the cervix was grasped with a single tooth tenaculum.  A  Pipelle  was then used to sound the uterus to 6.5 cm, then an endometrial  biopsy is obtained.  The patient tolerated the procedure well.  Following  this the tenaculum was removed.  There was no bleeding noted.  On bimanual  exam she has a retroverted uterus that is small.  There is no adnexal mass  or tenderness.  The wet prep showed numerous clue cells and red cells; no  trich, no hyphae.   IMPRESSION:  1. History of ASCUS on her last Pap smear.  2. Menometrorrhagia.  3. Bacterial vaginosis.  4. Candidal rash.  5. Anorgasmia.   PLAN:  1. Pap smear obtained today.  2. Flagyl 500 mg p.o. b.i.d.  3. Check  TSH, GC, and chlamydia today.  4. Pelvic ultrasound to rule out any anatomic pathology.  5. The patient will try self stimulation for orgasm induction.  If this     fails she will need a further physiologic workup.                                               Tinnie Gens, MD    TP/MEDQ  D:  02/22/2003  T:  02/22/2003  Job:  [id number]

## 2010-11-06 NOTE — Op Note (Signed)
NAMEEudell, Jackie Holden             ACCOUNT NO.:  [id number]   MEDICAL RECORD NO.:  [id number]          PATIENT TYPE:  INP   LOCATION:  9399                          FACILITY:  [facility]   PHYSICIAN:  Phil D. Okey Dupre, M.D.     DATE OF BIRTH:  12/09/1960   DATE OF PROCEDURE:  12/03/2004  DATE OF DISCHARGE:                                 OPERATIVE REPORT   PROCEDURE:  Attempted vaginal hysterectomy, total abdominal hysterectomy.   PREOPERATIVE DIAGNOSIS:  Intractable dysmenorrhea and menometrorrhagia.   POSTOPERATIVE DIAGNOSIS:  Intractable dysmenorrhea and menometrorrhagia.  Plus multiple myomata uteri.   FINDINGS:  On pelvic examination an enlarged mass of about 5 cm in diameter  thought to be leiomyomata was in the cul-de-sac behind the uterus.  Also it  was palpated another softer mass toward the left adnexa near the broad  ligament.   DESCRIPTION OF PROCEDURE:  Under satisfactory general anesthesia with the  patient in the dorsal lithotomy position, the abdomen, perineum, and vagina  were prepped and draped in the usual sterile fashion with the abdomen  covered in case we had to go abdominally in this nulliparous patient.  A  weighted speculum was placed in the posterior fourchette of the vagina.  The  cervix was injected with 1% Xylocaine with 1:200,000 epinephrine to try and  reduce bleeding and also for easier plane identified.  A circumferential  incision was made around the entire circumference of the cervix and an  attempt to get into the bladder was then taken up by sharp dissect away from  the anterior surface of the uterus, however, we could not get into the  peritoneal cavity that way, so using the Mayo scissors, we entered in the  cul-de-sac with Riley Lam and serially the uterosacral ligaments and cardinal  ligaments were clamped, divided, doubly ligated with 1 chromic catgut suture  ligature as was the packet with the uterine vessels.  At this point we were  able to enter  the peritoneal cavity anteriorly, but after much manipulation,  it was obvious that the uterus would not budge, the mass in the cul-de-sac  would not come down, and coring did not seem practical when we started.  Therefore, it was decided to go abdominally.  A Foley catheter was placed in  the urinary bladder.  The abdomen had already been prepped and draped and  was entered through a Pfannenstiel incision, situated 3 cm above the  symphysis pubis and extending for a total length of 14 cm.  On entering the  peritoneal cavity, the uterus was markedly irregular with multiple  leiomyomata uteri and the abdomen was somewhat tight.  We could not bring  the uterus out without enlarging the incision and therefore the right rectus  muscle was transected by sharp dissection.  A Balfour retractor placed into  the peritoneal cavity, the bowel packed away, and Heaney clamps were placed  lateral to the uterus and across the fallopian tubes, mesosalpinx beneath,  and including the round ligament.  The round ligaments were then ligated,  divided, and the anterior leaf of the broad ligament opened.  Parallel to  the uterus and extended around the anterior superior portion of the cervix.  Opening made in the avascular portion of broad ligament through which 1  chromic sutures were placed and tied.  The tissue was then excised medial to  the tie and the lateral pedicle ligated with 1 chromic catgut suture  ligatures.  The uterine vessels around the cervix were once again clamped  and divided and ligated with 1 chromic catgut suture ligature to make sure  there was no further bleeding.  At that point, the uterus was removed in  toto.  Angle sutures of 1 chromic catgut were placed in each of the lateral  vaginal cuff angles and figure-of-eights used to close the vaginal cuff.  Areas observed for bleeding, none was noted.  The pelvis was irrigated.  Both ovaries were normal.  The fascia was closed with a  continuous running 0  Vicryl on an atraumatic needle.  Subcutaneous bleeders were controlled with  hot cautery.  Skin  edges approximated with skin staples.  Tape, instrument, sponge, and needle  count reported correct at the end of the procedure.  Total blood loss was  approximately 800 mL.  The patient tolerated the procedure well and was  transferred to the recovery room in satisfactory condition.       PDR/MEDQ  D:  12/03/2004  T:  12/03/2004  Job:  [id number]

## 2011-03-30 LAB — WOUND CULTURE
Culture: NO GROWTH
Gram Stain: NONE SEEN

## 2011-03-30 LAB — HERPES SIMPLEX VIRUS CULTURE: Culture: NOT DETECTED

## 2011-04-02 ENCOUNTER — Other Ambulatory Visit (HOSPITAL_COMMUNITY)
Admission: RE | Admit: 2011-04-02 | Discharge: 2011-04-02 | Disposition: A | Discharge status: Home / Self Care | Payer: BC Managed Care – PPO | Source: Ambulatory Visit | Attending: Family Medicine | Admitting: Family Medicine

## 2011-04-02 ENCOUNTER — Ambulatory Visit (INDEPENDENT_AMBULATORY_CARE_PROVIDER_SITE_OTHER): Payer: BC Managed Care – PPO | Admitting: Family Medicine

## 2011-04-02 DIAGNOSIS — Z01419 Encounter for gynecological examination (general) (routine) without abnormal findings: Secondary | ICD-10-CM | POA: Insufficient documentation

## 2011-04-02 DIAGNOSIS — N898 Other specified noninflammatory disorders of vagina: Secondary | ICD-10-CM

## 2011-04-02 DIAGNOSIS — R300 Dysuria: Secondary | ICD-10-CM

## 2011-04-02 DIAGNOSIS — Z124 Encounter for screening for malignant neoplasm of cervix: Secondary | ICD-10-CM

## 2011-04-02 DIAGNOSIS — Z23 Encounter for immunization: Secondary | ICD-10-CM

## 2011-04-02 DIAGNOSIS — N760 Acute vaginitis: Secondary | ICD-10-CM

## 2011-04-02 DIAGNOSIS — J069 Acute upper respiratory infection, unspecified: Secondary | ICD-10-CM

## 2011-04-02 LAB — POCT URINALYSIS DIPSTICK
Bilirubin, UA: NEGATIVE
Glucose, UA: NEGATIVE
Ketones, UA: NEGATIVE
Spec Grav, UA: 1.02

## 2011-04-02 LAB — POCT WET PREP (WET MOUNT)
Trichomonas Wet Prep HPF POC: NEGATIVE
Yeast Wet Prep HPF POC: NEGATIVE

## 2011-04-02 LAB — POCT UA - MICROSCOPIC ONLY

## 2011-04-02 MED ORDER — BENZONATATE 100 MG PO CAPS: 100.0000 mg | ORAL_CAPSULE | Freq: Three times a day (TID) | ORAL | Status: AC | PRN

## 2011-04-02 NOTE — Progress Notes (Signed)
  Subjective:    Patient ID: Jackie Holden, female    DOB: 13-May-1961, 50 y.o.   MRN: [id number]  HPI    Review of Systems     Objective:   Physical Exam        Assessment & Plan:   Subjective:     Jackie Holden is a 50 y.o. female and is here for a comprehensive physical exam. The patient reports problems - vaginal discharge and cough, congestion, sore throat.Marland Kitchen   Health Maintenance  Topic Date Due  . Pap Smear  03/06/1981  . Influenza Vaccine  03/22/2011  . Tetanus/tdap  04/01/2021    The following portions of the patient's history were reviewed and updated as appropriate: allergies, current medications, past medical history and problem list.  Review of Systems A comprehensive review of systems was negative except for: Ears, nose, mouth, throat, and face: positive for nasal congestion and sore throat Respiratory: positive for cough and sputum Genitourinary: positive for vaginal discharge   Objective:    General appearance: cooperative and no distress Eyes: conjunctivae/corneas clear. PERRL, EOM's intact. Fundi benign. Nose: Nares normal. Septum midline. Mucosa normal. No drainage or sinus tenderness. Throat: oropharynx moist and clear, no exudates Neck: no adenopathy and supple, symmetrical, trachea midline Lungs: coarse breath sounds, but no wheezes/rales/rhonchi Heart: regular rate and rhythm, S1, S2 normal, no murmur, click, rub or gallop Abdomen: soft, non-tender; bowel sounds normal; no masses,  no organomegaly Pelvic: cervix normal in appearance, external genitalia normal, no adnexal masses or tenderness, no cervical motion tenderness, positive findings: scant, yellow vaginal discharge and vagina normal without discharge Extremities: extremities normal, atraumatic, no cyanosis or edema Skin: Skin color, texture, turgor normal. No rashes or lesions Neurologic: Grossly normal    Assessment:    Healthy female exam.      URI with persistent  cough.  Vaginal discharge.  Plan:    URI: see problem list  Vaginal discharge: see problem list  See After Visit Summary for Counseling Recommendations   Return to clinic for annual CPE in 12 months.

## 2011-04-02 NOTE — Patient Instructions (Signed)
It was nice to meet you today! I will call or send letter with lab results. If cough does not improve in 2 weeks, please call MD for new prescription - Tessalon perles. You may schedule an annual physical exam in one year. Thanks, Dr. Sherron Flemings Cruz  Upper Respiratory Infection (URI), Adult You have an upper respiratory infection (URI). This is also known as the common cold. The upper respiratory tract includes the nose, sinuses, throat, trachea, and bronchi (airways leading to the lungs).  CAUSE Many different viruses can infect the tissues lining the upper respiratory tract. The tissues become irritated and inflamed and often become very moist. A cold is contagious. You can easily spread the virus to others by oral contact. This includes kissing, sharing a glass, and coughing or sneezing. It can also be spread by touching your mouth or nose and then touching a surface which is then touched by another person. Symptoms typically develop one to three days after you come in contact with a cold virus. SYMPTOMS Symptoms vary from person to person. They may include runny nose, sneezing, nasal congestion, sinus irritation, sore throat, laryngitis, or cough. Fatigue, muscle aches, headache, and low grade fever may also occur. DIAGNOSIS You may diagnose your own upper respiratory infection based on familiar symptoms, since most people get a cold 2-3 times a year. Your caregiver can confirm this based on your examination. Most importantly, your caregiver can check that your symptoms are not due to another disease such as strep throat, sinusitis, pneumonia, asthma, epiglottis, or others. Blood tests, throat tests, and x-rays are not necessary to diagnose an upper respiratory infection.  PREVENTION The best way to protect against getting a cold is to practice good hygiene. Avoid oral or hand contact with people with cold symptoms. Wash hands often if contact occurs. There is no clear evidence that vitamin C,  vitamin E, Echinacea, or exercise reduces the chance of developing an upper respiratory infection. PROGNOSIS An upper respiratory infection is benign. Most people improve within a week, but symptoms can last two weeks. A residual cough may last a week longer. Complications can develop. RISKS AND COMPLICATIONS You may be at risk for a more severe case of the common cold if you smoke cigarettes, have chronic heart or lung disease, or if you have a weakened immune system. Bacterial sinusitis, middle ear infections, and bacterial pneumonia can complicate the common cold. The common cold can worsen asthma and COPD (chronic obstructive pulmonary disease). Sometimes these complications can be life threatening. TREATMENT Treatment is directed at relieving symptoms. There is no cure. Antibiotics are not effective. In fact, improper use of antibiotics can lead to the development of drug resistant bacteria. Non-medical treatment is safe and may be helpful:  Increased fluid intake.   Inhale heated mist or steam (vaporizer or shower).   Sip chicken soup.   Get plenty of rest.   Use gargles or lozenges for comfort.  Zinc gel and zinc lozenges, taken in the first 24 hours of the common cold, can shorten the duration and lessen the severity of symptoms. Pain medications may help fever, muscle aches, and throat pain. A variety of non-prescription medications are available to treat congestion and runny nose. Your caregiver can make recommendations and may suggest nasal or lung inhalers for other symptoms. Once again, antibiotics are not effective for upper respiratory infections.  HOME CARE INSTRUCTIONS  Only take over-the-counter or prescription medicines for pain, discomfort, or fever as directed by your caregiver.  Use a warm mist humidifier or inhale steam from a shower to increase air moisture. This may keep secretions moist and make it easier to breathe.   Drink plenty of clear liquids. You are  drinking enough fluids if the urine remains a light yellow color rather than a dark color.   Rest as needed.   Return to work when your temperature has returned to normal or as your caregiver advises. You may need to stay home longer to avoid infecting others. You can also use a face mask and careful hand washing to prevent spread of virus.  SEEK MEDICAL CARE IF:  After the first few days, you feel you are getting worse rather than better.   You need your caregiver's advice about medications to control symptoms.   You develop symptoms that you think suggest complications such as pneumonia, sinusitis, or strep throat.  SEEK IMMEDIATE MEDICAL CARE IF:  You develop an oral temperature above 102.5 or if the fever lasts more than 2 days.   You develop severe or persistent headache, ear pain, sinus pain, or chest pain.   You develop a prolonged cough, cough up blood, have a change in your usual mucus (if you have chronic lung disease), or develop wheezing.   You develop sore muscles, stiff neck, or severe headache not controlled with medications.  Document Released: 12/01/2000 Document Re-Released: 03/16/2008 [facility] Patient Information 2011 [place], [place].

## 2011-04-03 LAB — GC/CHLAMYDIA PROBE AMP, GENITAL
Chlamydia, DNA Probe: NEGATIVE
GC Probe Amp, Genital: NEGATIVE

## 2011-04-03 LAB — HIV ANTIBODY (ROUTINE TESTING W REFLEX): HIV: NONREACTIVE

## 2011-04-05 DIAGNOSIS — N898 Other specified noninflammatory disorders of vagina: Secondary | ICD-10-CM | POA: Insufficient documentation

## 2011-04-05 DIAGNOSIS — J069 Acute upper respiratory infection, unspecified: Secondary | ICD-10-CM | POA: Insufficient documentation

## 2011-04-05 MED ORDER — METRONIDAZOLE 500 MG PO TABS: 500.0000 mg | ORAL_TABLET | Freq: Two times a day (BID) | ORAL | Status: AC

## 2011-04-05 NOTE — Assessment & Plan Note (Signed)
Encouraged patient to continue taking Tylenol for cold, push plenty of fluids, and plenty of rest.  For persistent cough, will give Rx for Tessalon Perles.  Return to clinic if symptoms do not improve in 1-2 weeks.

## 2011-04-05 NOTE — Assessment & Plan Note (Signed)
Will get a wet prep, GC/Chlamydia, HIV, RPR, and urinalysis.  Discussed likely diagnosis of BV.  Will call or send letter with results and treat accordingly.

## 2011-04-06 ENCOUNTER — Telehealth: Payer: Self-pay | Admitting: *Deleted

## 2011-04-06 NOTE — Telephone Encounter (Signed)
Message copied by Deno Etienne on Tue Apr 06, 2011  8:45 AM ------      Message from: DE LA CRUZ, IVY      Created: Mon Apr 05, 2011  8:32 PM      Regarding: Flagyl       Hi team, will you please call patient and let her know that wet prep grew bacterial vaginosis (we discussed what BV was during the visit) and she can pick up Flagyl at her pharmacy.  Thank you!

## 2011-04-06 NOTE — Telephone Encounter (Signed)
lvm to inform pt of results of Wet prep: she has BV which was discussed at her office visit. Dr.de la Sondra Come called in an RX to her pharmacy.Jackie Holden

## 2011-04-07 ENCOUNTER — Telehealth: Payer: Self-pay | Admitting: *Deleted

## 2011-04-07 NOTE — Telephone Encounter (Signed)
Hey - I do not recall ordering any glucose or cholesterol labs.  We only discussed URI and GU symptoms.  Please call her back and schedule a lab visit for BMET and Fasting lipid panel if she still wants this done.

## 2011-04-07 NOTE — Telephone Encounter (Signed)
lvm for pt to return call regarding results of wetprep. Pt has BV, this was discussed during her OV by pcp. Rx has been called into her pharmacy.Laureen Ochs, Viann Shove

## 2011-04-07 NOTE — Telephone Encounter (Signed)
Informed pt of results. She stated that she picked up the Rx at the pharmacy.  Pt was inquiring about glucose and cholesterol results and I informed her that I did not see any orders for these labs. I will forward this to pcp. Laureen Ochs, Viann Shove

## 2011-04-08 NOTE — Telephone Encounter (Signed)
lvm and told pt that these labs were not discuss or drawn during her last visit and if she should want these to be done to call and schedule a lab appt for these to be done and that she will need to be fasting also for the cholesterol lab.Laureen Ochs, Viann Shove

## 2012-02-08 ENCOUNTER — Ambulatory Visit (INDEPENDENT_AMBULATORY_CARE_PROVIDER_SITE_OTHER): Payer: BC Managed Care – PPO | Admitting: Family Medicine

## 2012-02-08 ENCOUNTER — Telehealth: Payer: Self-pay | Admitting: *Deleted

## 2012-02-08 ENCOUNTER — Other Ambulatory Visit (HOSPITAL_COMMUNITY)
Admission: RE | Admit: 2012-02-08 | Discharge: 2012-02-08 | Disposition: A | Discharge status: Home / Self Care | Payer: BC Managed Care – PPO | Source: Ambulatory Visit | Attending: Family Medicine | Admitting: Family Medicine

## 2012-02-08 VITALS — BP 175/95 | HR 75 | Temp 98.1°F | Ht 60.0 in | Wt 176.0 lb

## 2012-02-08 DIAGNOSIS — N898 Other specified noninflammatory disorders of vagina: Secondary | ICD-10-CM

## 2012-02-08 DIAGNOSIS — M545 Low back pain, unspecified: Secondary | ICD-10-CM | POA: Insufficient documentation

## 2012-02-08 DIAGNOSIS — M549 Dorsalgia, unspecified: Secondary | ICD-10-CM

## 2012-02-08 DIAGNOSIS — Z113 Encounter for screening for infections with a predominantly sexual mode of transmission: Secondary | ICD-10-CM | POA: Insufficient documentation

## 2012-02-08 LAB — POCT URINALYSIS DIPSTICK
Leukocytes, UA: NEGATIVE
Protein, UA: NEGATIVE
Urobilinogen, UA: 0.2
pH, UA: 5.5

## 2012-02-08 LAB — POCT UA - MICROSCOPIC ONLY

## 2012-02-08 LAB — POCT WET PREP (WET MOUNT)

## 2012-02-08 MED ORDER — METRONIDAZOLE 500 MG PO TABS: 500.0000 mg | ORAL_TABLET | Freq: Two times a day (BID) | ORAL | Status: AC

## 2012-02-08 NOTE — Telephone Encounter (Signed)
Pt called back and Karie informed. Jany Buckwalter, Maryjo Rochester

## 2012-02-08 NOTE — Progress Notes (Signed)
  Subjective:    Patient ID: Jackie Holden, female    DOB: Apr 21, 1961, 51 y.o.   MRN: [id number]  HPI # Lower back pain, "fishy" vaginal odor and discharge For the past 1-2 days  She last had sexual intercourse several days ago with her boyfriend. She is monogamous, however, she is not sure he is She denies fevers/chills/nausea/vomiting  She denies vaginal irritation/urinary frequency or urgency  Medications tried: 1-2 tablets of ibuprofen daily do not provide much relief for her back pain   Review of Systems Per HPI She had dysuria for 1 or 2 voids but none since then  Allergies, medication, past medical history reviewed.  Significant for: -Smoker -HTN -She reports history of chlamydia in the past -She is s/p hysterectomy for DUB. Her ovaries are intact.     Objective:   Physical Exam GEN: appears uncomfortable; well-nourished PSYCH: appropriate to questions CV: RRR PULM: NI WOB ABD: NABS, soft, NT, ND BACK: she has lower back pain but it is not reproducible with palpation; no CVA tenderness GU: normal vulva and vagina; no vaginal irritation; thick white discharge posterior vault; no cervix but cuff SKIN: no rash     Assessment & Plan:

## 2012-02-08 NOTE — Assessment & Plan Note (Signed)
Wet prep shows BV I am not sure why she is having lower back pain. Her urinalysis is not consistent for UTI, with minimal WBC (0-3). Do not see indication to send for culture at this time. Her back pain is also not reproducible, which makes MSK etiology unlikely.  She does not have a cervix (she is s/p hysterectomy), so PID is ruled-out.  -Will treat for BV and see how she does. Follow-up as needed. She was given indications to RTC or go to the ED, including fevers/chills, worsening back pain.  -Advised she take ibuprofen for pain as needed.

## 2012-02-08 NOTE — Patient Instructions (Signed)
You have BV.   Take the antibiotic twice daily for 1 week.   Try ibuprofen 800 mg every 8 hours as needed for pain.  If your back pain worsens in the next few days or if you start having fevers/chills/nausea, please return to clinic or go to the ED.

## 2012-02-08 NOTE — Assessment & Plan Note (Signed)
See above regarding vaginal discharge

## 2012-02-08 NOTE — Telephone Encounter (Signed)
Pt called and asked about the 800mg  ibuprofen.  Spoke with MD she can take 4 of the 200mg  tablets and it is the same thing.  This will more than likely be cheaper than her insurance paying for it.  Please informed pt when she calls back .Simaya Lumadue, Maryjo Rochester

## 2012-02-09 ENCOUNTER — Encounter: Payer: Self-pay | Admitting: Family Medicine

## 2012-02-10 ENCOUNTER — Telehealth: Payer: Self-pay | Admitting: Family Medicine

## 2012-02-10 NOTE — Telephone Encounter (Signed)
Message copied by [facility], Etta Quill on Thu Feb 10, 2012  9:29 AM ------      Message from: [facility], [place] J      Created: Tue Feb 08, 2012  1:30 PM       Call 08/23 and see if back pain is improved with Tx for BV

## 2012-02-10 NOTE — Telephone Encounter (Signed)
Back pain is 50% improved with a few doses of metronidazole.  Advised to continue Notified of other normal labs.

## 2012-02-17 NOTE — Telephone Encounter (Signed)
Pt states that her back pain is about 6 or 7 - on the left side - it is not all the time, just when she moves and lays down a certain way Stopped taking Ibuprofen for now but start taking as needed - wants to speak with Dr Tommi Rumps Lawson Radar about this

## 2012-02-18 NOTE — Telephone Encounter (Signed)
Called patient back to see if she was still have pain.  Left a message to call us back.  Gave her my name.

## 2012-02-18 NOTE — Telephone Encounter (Signed)
If she is still having pain, she should try to make an appointment this afternoon and be seen by any provider.  If she can wait until next week, then she can schedule an appointment with me early in the week.  Thanks.

## 2013-11-27 ENCOUNTER — Ambulatory Visit: Payer: BC Managed Care – PPO | Admitting: Family Medicine

## 2013-12-07 ENCOUNTER — Encounter: Payer: Self-pay | Admitting: Family Medicine

## 2013-12-07 ENCOUNTER — Other Ambulatory Visit (HOSPITAL_COMMUNITY)
Admission: RE | Admit: 2013-12-07 | Discharge: 2013-12-07 | Disposition: A | Discharge status: Home / Self Care | Payer: BC Managed Care – PPO | Source: Ambulatory Visit | Attending: Family Medicine | Admitting: Family Medicine

## 2013-12-07 ENCOUNTER — Ambulatory Visit (INDEPENDENT_AMBULATORY_CARE_PROVIDER_SITE_OTHER): Payer: BC Managed Care – PPO | Admitting: Family Medicine

## 2013-12-07 VITALS — BP 156/100 | HR 85 | Temp 98.2°F | Wt 145.0 lb

## 2013-12-07 DIAGNOSIS — Z0000 Encounter for general adult medical examination without abnormal findings: Secondary | ICD-10-CM

## 2013-12-07 DIAGNOSIS — Z202 Contact with and (suspected) exposure to infections with a predominantly sexual mode of transmission: Secondary | ICD-10-CM

## 2013-12-07 DIAGNOSIS — Z124 Encounter for screening for malignant neoplasm of cervix: Secondary | ICD-10-CM

## 2013-12-07 DIAGNOSIS — Z1272 Encounter for screening for malignant neoplasm of vagina: Secondary | ICD-10-CM

## 2013-12-07 DIAGNOSIS — Z1151 Encounter for screening for human papillomavirus (HPV): Secondary | ICD-10-CM | POA: Insufficient documentation

## 2013-12-07 DIAGNOSIS — Z113 Encounter for screening for infections with a predominantly sexual mode of transmission: Secondary | ICD-10-CM | POA: Insufficient documentation

## 2013-12-07 DIAGNOSIS — Z01419 Encounter for gynecological examination (general) (routine) without abnormal findings: Secondary | ICD-10-CM

## 2013-12-07 DIAGNOSIS — I10 Essential (primary) hypertension: Secondary | ICD-10-CM

## 2013-12-07 DIAGNOSIS — N760 Acute vaginitis: Secondary | ICD-10-CM

## 2013-12-07 DIAGNOSIS — A499 Bacterial infection, unspecified: Secondary | ICD-10-CM

## 2013-12-07 DIAGNOSIS — B9689 Other specified bacterial agents as the cause of diseases classified elsewhere: Secondary | ICD-10-CM

## 2013-12-07 LAB — POCT WET PREP (WET MOUNT): Clue Cells Wet Prep Whiff POC: POSITIVE

## 2013-12-07 MED ORDER — METRONIDAZOLE 500 MG PO TABS: 500.0000 mg | ORAL_TABLET | Freq: Two times a day (BID) | ORAL | Status: DC

## 2013-12-07 NOTE — Assessment & Plan Note (Signed)
Cont to be elevated without medication; elevated on repeat exam as well  Is very adamant that she does not want medication Reviewed the potential dangers of allowing BP to cont to run high  Pt will attempt to incorporate high interval training/sustained cardio (outside of work) Information given and DASH diet discussed in extensive detail Pt to return in 2-4 weeks time for repeat assessment

## 2013-12-07 NOTE — Assessment & Plan Note (Signed)
Has been monogamous but does not use protection with intercourse Denies sx Wet prep thus far post for BV- sent in RX for flagyl X7 days BID Attempted to call pt with information but was unavailable HIV, RPR, HepC sent to lab

## 2013-12-07 NOTE — Patient Instructions (Signed)
Ms Jackie Holden it was great to see you today!  I am pleased to hear that things are going well for you. For your blood pressure please consider the DASH diet below Please also work on increasing the amount of SUSTAINED physical activity that you do Try incorporating jumping jacks, high knees, butt kicks for at least a minute to raise heart rate  I will call you if the results look abnormal Looking forward to seeing you soon Please schedule another appointment in 2-4 weeks Jackie FerrettiMelanie C [name], MD   DASH Eating Plan DASH stands for "Dietary Approaches to Stop Hypertension." The DASH eating plan is a healthy eating plan that has been shown to reduce high blood pressure (hypertension). Additional health benefits may include reducing the risk of type 2 diabetes mellitus, heart disease, and stroke. The DASH eating plan may also help with weight loss. WHAT DO I NEED TO KNOW ABOUT THE DASH EATING PLAN? For the DASH eating plan, you will follow these general guidelines:  Choose foods with a percent daily value for sodium of less than 5% (as listed on the food label).  Use salt-free seasonings or herbs instead of table salt or sea salt.  Check with your health care provider or pharmacist before using salt substitutes.  Eat lower-sodium products, often labeled as "lower sodium" or "no salt added."  Eat fresh foods.  Eat more vegetables, fruits, and low-fat dairy products.  Choose whole grains. Look for the word "whole" as the first word in the ingredient list.  Choose fish and skinless chicken or [place] more often than red meat. Limit fish, poultry, and meat to 6 oz (170 g) each day.  Limit sweets, desserts, sugars, and sugary drinks.  Choose heart-healthy fats.  Limit cheese to 1 oz (28 g) per day.  Eat more home-cooked food and less restaurant, buffet, and fast food.  Limit fried foods.  Cook foods using methods other than frying.  Limit canned vegetables. If you do use them, rinse them  well to decrease the sodium.  When eating at a restaurant, ask that your food be prepared with less salt, or no salt if possible. WHAT FOODS CAN I EAT? Seek help from a dietitian for individual calorie needs. Grains Whole grain or whole wheat bread. Brown rice. Whole grain or whole wheat pasta. Quinoa, bulgur, and whole grain cereals. Low-sodium cereals. Corn or whole wheat flour tortillas. Whole grain cornbread. Whole grain crackers. Low-sodium crackers. Vegetables Fresh or frozen vegetables (raw, steamed, roasted, or grilled). Low-sodium or reduced-sodium tomato and vegetable juices. Low-sodium or reduced-sodium tomato sauce and paste. Low-sodium or reduced-sodium canned vegetables.  Fruits All fresh, canned (in natural juice), or frozen fruits. Meat and Other Protein Products Ground beef (85% or leaner), grass-fed beef, or beef trimmed of fat. Skinless chicken or [place]key. Ground chicken or [place]key. Pork trimmed of fat. All fish and seafood. Eggs. Dried beans, peas, or lentils. Unsalted nuts and seeds. Unsalted canned beans. Dairy Low-fat dairy products, such as skim or 1% milk, 2% or reduced-fat cheeses, low-fat ricotta or cottage cheese, or plain low-fat yogurt. Low-sodium or reduced-sodium cheeses. Fats and Oils Tub margarines without trans fats. Light or reduced-fat mayonnaise and salad dressings (reduced sodium). Avocado. Safflower, olive, or canola oils. Natural peanut or almond butter. Other Unsalted popcorn and pretzels. The items listed above may not be a complete list of recommended foods or beverages. Contact your dietitian for more options. WHAT FOODS ARE NOT RECOMMENDED? Grains White bread. White pasta. White rice. Refined cornbread.  Bagels and croissants. Crackers that contain trans fat. Vegetables Creamed or fried vegetables. Vegetables in a cheese sauce. Regular canned vegetables. Regular canned tomato sauce and paste. Regular tomato and vegetable juices. Fruits Dried  fruits. Canned fruit in light or heavy syrup. Fruit juice. Meat and Other Protein Products Fatty cuts of meat. Ribs, chicken wings, bacon, sausage, bologna, salami, chitterlings, fatback, hot dogs, bratwurst, and packaged luncheon meats. Salted nuts and seeds. Canned beans with salt. Dairy Whole or 2% milk, cream, half-and-half, and cream cheese. Whole-fat or sweetened yogurt. Full-fat cheeses or blue cheese. Nondairy creamers and whipped toppings. Processed cheese, cheese spreads, or cheese curds. Condiments Onion and garlic salt, seasoned salt, table salt, and sea salt. Canned and packaged gravies. Worcestershire sauce. Tartar sauce. Barbecue sauce. Teriyaki sauce. Soy sauce, including reduced sodium. Steak sauce. Fish sauce. Oyster sauce. Cocktail sauce. Horseradish. Ketchup and mustard. Meat flavorings and tenderizers. Bouillon cubes. Hot sauce. Tabasco sauce. Marinades. Taco seasonings. Relishes. Fats and Oils Butter, stick margarine, lard, shortening, ghee, and bacon fat. Coconut, palm kernel, or palm oils. Regular salad dressings. Other Pickles and olives. Salted popcorn and pretzels. The items listed above may not be a complete list of foods and beverages to avoid. Contact your dietitian for more information. WHERE CAN I FIND MORE INFORMATION? National Heart, Lung, and Blood Institute: CablePromo.itwww.nhlbi.nih.gov/health/health-topics/topics/dash/ Document Released: 05/27/2011 Document Revised: 06/12/2013 Document Reviewed: 04/11/2013 [facility] Patient Information 2015 [place]. This information is not intended to replace advice given to you by your health care provider. Make sure you discuss any questions you have with your health care provider.

## 2013-12-07 NOTE — Assessment & Plan Note (Signed)
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

## 2013-12-07 NOTE — Progress Notes (Signed)
Patient ID: Danise Minaenelope O Barz, female   DOB: 1961-05-07, 53 y.o.   MRN: [id number]   [facility] Charlane FerrettiMelanie C Marsh, MD Phone: [phone]  Subjective:  Ms Melrose NakayamaKindle is a 53 y.o F who presents to establish care  # HTN -does not want medication, very resistant to taking anything  -BP has been as high as 170s/90s in the past  -denies CP, palps, SOB, vision changes -interested in increasing physical activity more- is currently only walking with her job (works at [employer]) -admits to eating lots of frozen meals and pastas, has limited fruits and vegetables in her diet  #HCM -would like STD testing (has had same partner for years but does not use condoms) -does not c/o vaginal discharge, pruritis, foul smell -partner is healthy as far as she is aware  All systems were reviewed and were negative unless otherwise noted in the HPI  Past Medical History Patient Active Problem List   Diagnosis Date Noted  . Lower back pain 02/08/2012  . URI (upper respiratory infection) 04/05/2011  . Vaginal Discharge 04/05/2011  . POTENTIAL CARIES 10/31/2009  . DYSURIA 10/28/2009  . UNSPECIFIED SUBJECTIVE VISUAL DISTURBANCE 07/15/2009  . OBESITY, UNSPECIFIED 06/16/2009  . SMOKER 06/16/2009  . ESSENTIAL HYPERTENSION, BENIGN 06/16/2009   Reviewed problem list.  Medications- reviewed and updated Chief complaint-noted No additions to family history Social history- patient is a never smoker  Objective: BP 156/100  Pulse 85  Temp(Src) 98.2 F (36.8 C) (Oral)  Wt 145 lb (65.772 kg) Gen: NAD, alert, cooperative with exam HEENT: NCAT, EOMI, PERRL, TMs nml Neck: FROM, supple CV: RRR, good S1/S2, no murmur, cap refill <3 Resp: CTABL, no wheezes, non-labored Abd: SNTND, BS present, no guarding or organomegaly GU: sparse pubic hair noted, no vag atrophy, blind vaginal pouch (had hysterectomy several years ago) nml white vag discharge present, no foul odor  Ext: No edema,  warm, normal tone, moves UE/LE spontaneously Neuro: Alert and oriented, No gross deficits Skin: has sml cysts which are soft, non fixed, mobile and compressible on right hand and upper arm   Assessment/Plan: See problem based a/p

## 2013-12-08 LAB — RPR

## 2013-12-08 LAB — HEPATITIS C ANTIBODY: HCV Ab: NEGATIVE

## 2013-12-08 LAB — HIV ANTIBODY (ROUTINE TESTING W REFLEX): HIV 1&2 Ab, 4th Generation: NONREACTIVE

## 2013-12-11 LAB — CYTOLOGY - PAP

## 2014-08-22 ENCOUNTER — Other Ambulatory Visit (HOSPITAL_COMMUNITY)
Admission: RE | Admit: 2014-08-22 | Discharge: 2014-08-22 | Disposition: A | Discharge status: Home / Self Care | Payer: BLUE CROSS/BLUE SHIELD | Source: Ambulatory Visit | Attending: Family Medicine | Admitting: Family Medicine

## 2014-08-22 ENCOUNTER — Ambulatory Visit (INDEPENDENT_AMBULATORY_CARE_PROVIDER_SITE_OTHER): Payer: BLUE CROSS/BLUE SHIELD | Admitting: Family Medicine

## 2014-08-22 ENCOUNTER — Encounter: Payer: Self-pay | Admitting: Family Medicine

## 2014-08-22 ENCOUNTER — Ambulatory Visit: Payer: Self-pay | Admitting: Family Medicine

## 2014-08-22 VITALS — BP 156/104 | HR 86 | Temp 98.4°F | Wt 152.0 lb

## 2014-08-22 DIAGNOSIS — N898 Other specified noninflammatory disorders of vagina: Secondary | ICD-10-CM

## 2014-08-22 DIAGNOSIS — B9689 Other specified bacterial agents as the cause of diseases classified elsewhere: Secondary | ICD-10-CM

## 2014-08-22 DIAGNOSIS — A499 Bacterial infection, unspecified: Secondary | ICD-10-CM

## 2014-08-22 DIAGNOSIS — N760 Acute vaginitis: Secondary | ICD-10-CM | POA: Diagnosis not present

## 2014-08-22 DIAGNOSIS — Z113 Encounter for screening for infections with a predominantly sexual mode of transmission: Secondary | ICD-10-CM | POA: Diagnosis present

## 2014-08-22 LAB — POCT WET PREP (WET MOUNT): Clue Cells Wet Prep Whiff POC: POSITIVE

## 2014-08-22 MED ORDER — METRONIDAZOLE 500 MG PO TABS: 500.0000 mg | ORAL_TABLET | Freq: Two times a day (BID) | ORAL | Status: DC

## 2014-08-22 MED ORDER — METRONIDAZOLE 500 MG PO TABS
500.0000 mg | ORAL_TABLET | Freq: Two times a day (BID) | ORAL | Status: DC
Start: 2014-08-22 — End: 2015-01-16

## 2014-08-22 NOTE — Progress Notes (Signed)
   Subjective:    Patient ID: Jackie Holden, female    DOB: 11/03/1960, 54 y.o.   MRN: [id number]  Seen for Same day visit for   CC: Vaginal discharge  VAGINAL DISCHARGE  Having vaginal discharge for 14 days. Medications tried: none Discharge consistency: yellow, thick Discharge color: yellow Recent antibiotic use: no Sex in last month: yes Possible STD exposure: yes  Symptoms Fever: no Dysuria:no Vaginal bleeding: no Abdomen or Pelvic pain: cramping Back pain: no Genital sores or ulcers:no Rash: no Pain during sex: no Missed menstrual period: NA - s/p hysterectomy   ROS see HPI Smoking Status noted  Objective:  BP 156/104 mmHg  Pulse 86  Temp(Src) 98.4 F (36.9 C) (Oral)  Wt 152 lb (68.947 kg)  General: NAD Speculum Exam: Ext genitalia: wnl; Vaginal discharge: yellow/green; Cervix: wnl Bimanual Exam: No Cervical motion tenderness; No Vaginal wall defects; Adnexa nontender     Assessment & Plan:  See Problem List Documentation

## 2014-08-22 NOTE — Assessment & Plan Note (Signed)
Yellow/green discharge on pelvis exam concerning for Trich but none seen on wet mount - + BV; Rx for Flagyl 500mg  BID x 7 days - Advised returning to clinic if symptoms return after treatment. Would recheck for trich if that happens and advise getting her partner treated.

## 2014-08-22 NOTE — Patient Instructions (Signed)
Vaginal Discharge Treatment - you should: Flagyl 500mg  twice a day for 7 days You should be better in: 1 week Call us if you have severe abdominal pain or sores or if you are not better in 1 week

## 2014-08-23 LAB — GC/CHLAMYDIA PROBE AMP (~~LOC~~) NOT AT ARMC
Chlamydia: NEGATIVE
Neisseria Gonorrhea: NEGATIVE

## 2014-09-02 ENCOUNTER — Telehealth: Payer: Self-pay | Admitting: Student

## 2014-09-02 NOTE — Telephone Encounter (Signed)
Would like for someone within the clinical staff to call her back about her last visit (would not give me more info than that) / sr

## 2014-09-02 NOTE — Telephone Encounter (Signed)
Pt states that she did have some yellow and "bloody" discharge.  She hasn't seen blood since her hysterectomy.  Will forward to MD for advise. Joshva Labreck,CMA

## 2014-09-03 NOTE — Telephone Encounter (Signed)
Returned phone call. Pt had 1 day small amount of bleeding but nothing since. Took 7 days of flagyl. Discharge significantly decreased but states still having some intermittent and wondering if the tests could be done again. Stated she would need to come in for re-collection of wet prep because samples are discarded after being performed the day of. Pt concerned about $40 copay but will call front office in the morning to see what she can do. -Dr. Waynetta SandyWight

## 2014-09-07 ENCOUNTER — Encounter (HOSPITAL_BASED_OUTPATIENT_CLINIC_OR_DEPARTMENT_OTHER): Payer: Self-pay | Admitting: Emergency Medicine

## 2014-09-07 ENCOUNTER — Emergency Department (HOSPITAL_BASED_OUTPATIENT_CLINIC_OR_DEPARTMENT_OTHER)
Admission: EM | Admit: 2014-09-07 | Discharge: 2014-09-07 | Disposition: A | Discharge status: Home / Self Care | Payer: BLUE CROSS/BLUE SHIELD | Attending: Emergency Medicine | Admitting: Emergency Medicine

## 2014-09-07 DIAGNOSIS — Z792 Long term (current) use of antibiotics: Secondary | ICD-10-CM | POA: Diagnosis not present

## 2014-09-07 DIAGNOSIS — N939 Abnormal uterine and vaginal bleeding, unspecified: Secondary | ICD-10-CM | POA: Diagnosis present

## 2014-09-07 DIAGNOSIS — N898 Other specified noninflammatory disorders of vagina: Secondary | ICD-10-CM | POA: Insufficient documentation

## 2014-09-07 DIAGNOSIS — Z79899 Other long term (current) drug therapy: Secondary | ICD-10-CM | POA: Insufficient documentation

## 2014-09-07 LAB — URINALYSIS, ROUTINE W REFLEX MICROSCOPIC
BILIRUBIN URINE: NEGATIVE
GLUCOSE, UA: NEGATIVE mg/dL
Hgb urine dipstick: NEGATIVE
Ketones, ur: NEGATIVE mg/dL
Nitrite: NEGATIVE
PROTEIN: NEGATIVE mg/dL
Specific Gravity, Urine: 1.015 (ref 1.005–1.030)
UROBILINOGEN UA: 1 mg/dL (ref 0.0–1.0)
pH: 7.5 (ref 5.0–8.0)

## 2014-09-07 LAB — WET PREP, GENITAL
CLUE CELLS WET PREP: NONE SEEN
TRICH WET PREP: NONE SEEN
Yeast Wet Prep HPF POC: NONE SEEN

## 2014-09-07 LAB — URINE MICROSCOPIC-ADD ON

## 2014-09-07 MED ORDER — DOXYCYCLINE HYCLATE 100 MG PO TABS
100.0000 mg | ORAL_TABLET | Freq: Once | ORAL | Status: AC
  Administered 2014-09-07: 100 mg via ORAL
  Filled 2014-09-07: qty 1

## 2014-09-07 MED ORDER — DOXYCYCLINE HYCLATE 100 MG PO CAPS: 100.0000 mg | ORAL_CAPSULE | Freq: Two times a day (BID) | ORAL | Status: DC

## 2014-09-07 NOTE — ED Notes (Signed)
pateint states that this started march 3rd, and was seen and treated - the patient states that it has started back. Was treated for bacterial infection in her vagina, last antibiotic last thursday

## 2014-09-07 NOTE — ED Provider Notes (Signed)
CSN: [id number]     Arrival date & time 09/07/14  0022 History   First MD Initiated Contact with Patient 09/07/14 0258     Chief Complaint  Patient presents with  . Vaginal Bleeding     (Consider location/radiation/quality/duration/timing/severity/associated sxs/prior Treatment) Patient is a 54 y.o. female presenting with vaginal discharge. The history is provided by the patient. No language interpreter was used.  Vaginal Discharge Quality:  Yellow Severity:  Moderate Onset quality:  Gradual Duration:  4 months Timing:  Constant Progression:  Unchanged Chronicity:  Chronic Context: not during intercourse   Relieved by:  Nothing Worsened by:  Nothing tried Ineffective treatments:  None tried Associated symptoms: no abdominal pain   Risk factors: no endometriosis     History reviewed. No pertinent past medical history. Past Surgical History  Procedure Laterality Date  . Abdominal hysterectomy     History reviewed. No pertinent family history. History  Substance Use Topics  . Smoking status: Never Smoker   . Smokeless tobacco: Not on file  . Alcohol Use: Not on file   OB History    No data available     Review of Systems  Gastrointestinal: Negative for abdominal pain.  Genitourinary: Positive for vaginal discharge.  All other systems reviewed and are negative.     Allergies  Review of patient's allergies indicates no known allergies.  Home Medications   Prior to Admission medications   Medication Sig Start Date End Date Taking? Authorizing Provider  Cascara [place] 450 MG CAPS Take 450 mg by mouth daily.    Historical Provider, MD  metroNIDAZOLE (FLAGYL) 500 MG tablet Take 1 tablet (500 mg total) by mouth 2 (two) times daily. 08/22/14   Jamal Collin, MD   BP 167/98 mmHg  Pulse 95  Temp(Src) 98.5 F (36.9 C) (Oral)  Resp 18  Ht 5' (1.524 m)  Wt 152 lb (68.947 kg)  BMI 29.69 kg/m2  SpO2 99% Physical Exam  Constitutional: She is oriented to person,  place, and time. She appears well-developed and well-nourished. No distress.  HENT:  Head: Normocephalic and atraumatic.  Mouth/Throat: Oropharynx is clear and moist.  Eyes: Conjunctivae and EOM are normal.  Neck: Normal range of motion. Neck supple.  Cardiovascular: Normal rate and regular rhythm.   Pulmonary/Chest: Effort normal. She has no wheezes. She has no rales.  Abdominal: Soft. Bowel sounds are normal. There is no tenderness. There is no rebound and no guarding.  Genitourinary: Vaginal discharge found.  No cervix no fistulas suture line at introitus is intact.  Chaperone present  Musculoskeletal: Normal range of motion.  Neurological: She is alert and oriented to person, place, and time.  Skin: Skin is warm and dry.  Psychiatric: She has a normal mood and affect.    ED Course  Procedures (including critical care time) Labs Review Labs Reviewed  URINALYSIS, ROUTINE W REFLEX MICROSCOPIC - Abnormal; Notable for the following:    Leukocytes, UA SMALL (*)    All other components within normal limits  URINE MICROSCOPIC-ADD ON - Abnormal; Notable for the following:    Squamous Epithelial / LPF FEW (*)    Bacteria, UA FEW (*)    All other components within normal limits  WET PREP, GENITAL  GC/CHLAMYDIA PROBE AMP ([place])    Imaging Review No results found.   EKG Interpretation None      MDM   Final diagnoses:  None   Was seen by PMD recently and treated for BV.  Negative trich  GC and chlamydia.    Will treat and have follow up with GYN for ongoing testing and care    Minsa Weddington, MD 09/07/14 [phone]

## 2014-09-07 NOTE — ED Notes (Signed)
patient states that "she has stool and blood and yellow discharge coming from her vagina, and I have had a hysterectomy"

## 2014-09-07 NOTE — ED Notes (Signed)
Pt states has had ? Vag bleeding x 1 last Sunday,   Yellow dc and stool in urine since march 3,  abd cramping this pm,  Has been seen and treated for same but no better

## 2014-09-09 ENCOUNTER — Ambulatory Visit: Payer: BLUE CROSS/BLUE SHIELD | Admitting: Family Medicine

## 2014-09-09 LAB — GC/CHLAMYDIA PROBE AMP (~~LOC~~) NOT AT ARMC
CHLAMYDIA, DNA PROBE: NEGATIVE
NEISSERIA GONORRHEA: NEGATIVE

## 2014-09-24 ENCOUNTER — Ambulatory Visit: Payer: BLUE CROSS/BLUE SHIELD | Admitting: Family Medicine

## 2014-10-01 ENCOUNTER — Other Ambulatory Visit (HOSPITAL_COMMUNITY)
Admission: RE | Admit: 2014-10-01 | Discharge: 2014-10-01 | Disposition: A | Discharge status: Home / Self Care | Payer: BLUE CROSS/BLUE SHIELD | Source: Ambulatory Visit | Attending: Family Medicine | Admitting: Family Medicine

## 2014-10-01 ENCOUNTER — Encounter: Payer: Self-pay | Admitting: Family Medicine

## 2014-10-01 ENCOUNTER — Telehealth: Payer: Self-pay | Admitting: Family Medicine

## 2014-10-01 ENCOUNTER — Ambulatory Visit (INDEPENDENT_AMBULATORY_CARE_PROVIDER_SITE_OTHER): Payer: BLUE CROSS/BLUE SHIELD | Admitting: Family Medicine

## 2014-10-01 VITALS — BP 125/78 | HR 92 | Temp 98.9°F | Ht 60.0 in | Wt 147.2 lb

## 2014-10-01 DIAGNOSIS — N939 Abnormal uterine and vaginal bleeding, unspecified: Secondary | ICD-10-CM

## 2014-10-01 DIAGNOSIS — Z113 Encounter for screening for infections with a predominantly sexual mode of transmission: Secondary | ICD-10-CM | POA: Insufficient documentation

## 2014-10-01 DIAGNOSIS — R102 Pelvic and perineal pain: Secondary | ICD-10-CM

## 2014-10-01 DIAGNOSIS — Z202 Contact with and (suspected) exposure to infections with a predominantly sexual mode of transmission: Secondary | ICD-10-CM

## 2014-10-01 LAB — POCT URINE PREGNANCY: PREG TEST UR: NEGATIVE

## 2014-10-01 LAB — POCT WET PREP (WET MOUNT): Clue Cells Wet Prep Whiff POC: NEGATIVE

## 2014-10-01 MED ORDER — AZITHROMYCIN 250 MG PO TABS
500.0000 mg | ORAL_TABLET | Freq: Once | ORAL | Status: DC
Start: 2014-10-01 — End: 2014-10-01

## 2014-10-01 MED ORDER — AZITHROMYCIN 250 MG PO TABS
1000.0000 mg | ORAL_TABLET | Freq: Once | ORAL | Status: AC
  Administered 2014-10-01: 1000 mg via ORAL

## 2014-10-01 MED ORDER — CEFTRIAXONE SODIUM 1 G IJ SOLR
250.0000 mg | Freq: Once | INTRAMUSCULAR | Status: AC
  Administered 2014-10-01: 250 mg via INTRAMUSCULAR

## 2014-10-01 NOTE — Progress Notes (Signed)
Patient ID: Danise Minaenelope O Hedtke, female   DOB: 1960-11-09, 54 y.o.   MRN: [id number] Subjective:   CC: Cramps and vaginal bleeding  HPI:   Patient with history of hysterectomy 2006 presents with pelvic cramps and vaginal spotting. She states that symptoms have been present for a little over one month. Crampy abdominal pain as 6 /10. Also feels like "food particles and my bowels" are coming from her vagina. Has had intermittent subjective fevers and chills since that time. Denies dysuria, nausea, vomiting, or concern of STD but had a STD (gonorrhea or chlamydia, patient unsure) at some point in the past. Has not tried any medication for pain.  Of note, patient seen for this in clinic March 3 at which point she was treated for BV. Also seen in the emergency room March 19 at which point she was suggested to be seen by gynecology.  Review of Systems - Per HPI.   PMH: Hypertension, low back pain, obesity, tobacco use SH- reports no sexual activity currently    Objective:  Physical Exam BP 125/78 mmHg  Pulse 92  Temp(Src) 98.9 F (37.2 C) (Oral)  Ht 5' (1.524 m)  Wt 147 lb 4 oz (66.792 kg)  BMI 28.76 kg/m2 GEN: NAD Abdomen: Soft, mildly tender in pelvis, nondistended GU: Normal external genitalia, no visualized or palpated prolapse, no bleeding or fecal material visualized, moderate thick yellow discharge, mild cervical motion tenderness, no masses or lesions palpated    Assessment:     Danise Minaenelope O Hyden is a 54 y.o. female here for vaginal discharge and bleeding.    Plan:     # See problem list and after visit summary for problem-specific plans.   # Health Maintenance: STD testing as above  Follow-up: Follow up in 1 week if lack of improvement in symptoms.     Leona SingletonMaria T Kiarrah Rausch, MD [facility]

## 2014-10-01 NOTE — Patient Instructions (Signed)
Good to see you.  We are treating for gonorrhea/chlamydia because of your longstanding pelvic discomfort. I will call you with lab results in 1-2 days once they result. Be sure to not have intercourse for about 1 week. You will get a call about the gynecology referral in 1-2 weeks. Call us if you hve not heard anything. Seek immediate care if you have severe increase of pain or fever or other symptoms. The bleeding could be a result of the infection with yellowish discharge but we are referring you to Gynecology because of the long-standing nature of your symptoms to consider further workup.  Best,  Leona SingletonMaria T Clela Hagadorn, MD

## 2014-10-01 NOTE — Assessment & Plan Note (Signed)
Purulent vaginal discharge with mild cervical motion tenderness is concerning for mild PID. Abdomen is nonacute. Patient is afebrile with normal vitals in clinic. Mild vaginal bleeding is most likely cervical irritation from infection. Patient's concern about fecal material is likely dry blood from vaginal vault, but given chronicity of this complaint along with pelvic pain, will refer to OB/GYN for evaluation. No fistula or prolapse seen on exam. Urine pregnancy test negative. -Referral placed to OB/GYN. -Empiric treatment with ceftriaxone IM 250 mg and azithromycin PO 1 g in clinic -Discussed abstaining from sexual activity until partner testing and treatment, but patient reports she is not sexually active. -Follow-up GC, Chlamydia, wet prep, HIV, RPR. -Tylenol or ibuprofen as needed for pain. -Follow-up in 1 week if no improvement or sooner if any increase in symptoms.

## 2014-10-01 NOTE — Telephone Encounter (Signed)
Attempted to call patient re: negative wet prep and UPT but went to voicemail. Will call again once other labs result.  Jackie SingletonMaria T [name], MD

## 2014-10-02 LAB — GC/CHLAMYDIA PROBE AMP (~~LOC~~) NOT AT ARMC
Chlamydia: NEGATIVE
Neisseria Gonorrhea: NEGATIVE

## 2014-10-02 LAB — RPR

## 2014-10-02 LAB — HIV ANTIBODY (ROUTINE TESTING W REFLEX): HIV 1&2 Ab, 4th Generation: NONREACTIVE

## 2014-10-02 NOTE — Telephone Encounter (Signed)
Was unable to reach patient about labs earlier. Please call her to let her know that all labs normal (neg UPT, wet prep, HIV, RPR, GC/Chlamydia). If symptoms do not improve after empiric treatment we gave yesterday, she should return in 1 week.  Jackie SingletonMaria T [name], MD

## 2014-10-03 NOTE — Telephone Encounter (Signed)
Advised pt as directed below and verbalized understanding. Ramesh Moan, CMA. 

## 2014-10-08 ENCOUNTER — Telehealth: Payer: Self-pay | Admitting: Student

## 2014-10-08 NOTE — Telephone Encounter (Signed)
Patient states that she is still having the vaginal discharge.  She would like to have her referral to Gynecology sent.  She also has requested to speak with Dr. Karie Schwalbe a little more about this. Gamaliel Charney,CMA

## 2014-10-08 NOTE — Telephone Encounter (Signed)
Pt says she was told to call in after 7 days from last visit to update MD on her condition, would like call back

## 2014-10-10 NOTE — Telephone Encounter (Signed)
Yes referral placed correctly.  First sent to [facility] but it was declined due to their providers will not evaluate pelvic pain.  [facility]  Yahel Fuston,CMA

## 2014-10-10 NOTE — Telephone Encounter (Signed)
Pt called regarding concern for vaginal bowel leakage. After confirming two pt identifiers pt was asked about her symptoms. She states that starting on 3/3 she noted what looks like bowel leaking from her vagina when she wipes. This discharge is yellow and thick in nature. She occasionally notes this when she urinates. She denies vaginal pain or foul odor. A referral to Gynecology was made at her PCP visit on 4/12 but she has yet to hear from them. She was [facility].  All questions were answered to her satisfaction. Pt will call back if she has further questions/concerns  Sidney Silberman A. Kennon RoundsHaney MD, MS Family Medicine Resident PGY-1 Pager [phone]

## 2014-10-10 NOTE — Telephone Encounter (Signed)
Interesting - it is not just pelvic pain but concern for pt complaint of stool coming out of vagina. OK, thank you.  Jackie SingletonMaria T Lexton Hidalgo, MD

## 2014-10-10 NOTE — Telephone Encounter (Signed)
Blue Team, just fwding to you to make sure I put in referral to gyn correctly. Thank you.  Called patient back. She states that abdominal pain has resolved but she still has vaginal discharge and when she has a BM, she still feels as though she is wiping food particles from her vagina. I told her I had placed referral at our visit and that process sometimes can take 2-3 weeks if not emergent but to call mid-end of next week if she had not heard anything. Since symptoms were improving, she could hold off on making f/u appt with us unless symptoms worsened/she re-developed abdominal pain or developed fever. Could order transvaginal US but hesitate as I would like gyn to eval and order test of choice. She voiced understanding.    Jackie SingletonMaria T Bryar Dahms, MD

## 2014-10-11 ENCOUNTER — Encounter: Payer: Self-pay | Admitting: Obstetrics & Gynecology

## 2014-10-16 ENCOUNTER — Ambulatory Visit: Payer: BLUE CROSS/BLUE SHIELD | Admitting: Family Medicine

## 2014-10-21 ENCOUNTER — Telehealth: Payer: Self-pay | Admitting: Student

## 2014-10-21 NOTE — Telephone Encounter (Signed)
Pt called to follow up symptoms of fecal-like material from vagina. She did not answer. She had an OB/Gyn appt on 5/16 which she cancelled. Will continue to follow  Alyssa A. Kennon RoundsHaney MD, MS Family Medicine Resident PGY-1 Pager [phone]

## 2014-11-04 ENCOUNTER — Encounter: Payer: BLUE CROSS/BLUE SHIELD | Admitting: Obstetrics & Gynecology

## 2015-01-14 ENCOUNTER — Telehealth: Payer: Self-pay | Admitting: Student

## 2015-01-14 NOTE — Telephone Encounter (Signed)
Spoke with patient and informed her that she would need to be seen for proper diagnosis.  Agreeable and appt made with Dr. Jennette Kettle for 8:45am. Burnard Hawthorne

## 2015-01-14 NOTE — Telephone Encounter (Signed)
Ms. Carreras is calling because she states that she needs a refill on "the medication last prescribed for bacterial vaginosis". I attempted to make an appt for the patient to analyze symptoms, but she insisted that she just needed a refill. Please contact patient at earliest convenience when/if possible. Thank you, [name], ASA

## 2015-01-15 ENCOUNTER — Ambulatory Visit (INDEPENDENT_AMBULATORY_CARE_PROVIDER_SITE_OTHER): Payer: BLUE CROSS/BLUE SHIELD | Admitting: Family Medicine

## 2015-01-15 ENCOUNTER — Telehealth: Payer: Self-pay | Admitting: Student

## 2015-01-15 ENCOUNTER — Encounter: Payer: Self-pay | Admitting: Family Medicine

## 2015-01-15 ENCOUNTER — Other Ambulatory Visit (HOSPITAL_COMMUNITY)
Admission: RE | Admit: 2015-01-15 | Discharge: 2015-01-15 | Disposition: A | Discharge status: Home / Self Care | Payer: BLUE CROSS/BLUE SHIELD | Source: Ambulatory Visit | Attending: Family Medicine | Admitting: Family Medicine

## 2015-01-15 VITALS — BP 146/75 | HR 80 | Temp 97.9°F | Ht 60.0 in | Wt 162.3 lb

## 2015-01-15 DIAGNOSIS — N898 Other specified noninflammatory disorders of vagina: Secondary | ICD-10-CM

## 2015-01-15 DIAGNOSIS — Z113 Encounter for screening for infections with a predominantly sexual mode of transmission: Secondary | ICD-10-CM | POA: Diagnosis present

## 2015-01-15 LAB — POCT WET PREP (WET MOUNT): Clue Cells Wet Prep Whiff POC: NEGATIVE

## 2015-01-15 MED ORDER — CLINDAMYCIN PHOSPHATE 100 MG VA SUPP: 100.0000 mg | Freq: Every day | VAGINAL | Status: DC

## 2015-01-15 NOTE — Progress Notes (Signed)
   Subjective:    Patient ID: Jackie Holden, female    DOB: 11-13-1960, 54 y.o.   MRN: [id number]  HPI  Several months of recurrent vaginal discharge that is yellow/white. Neck. Sometimes scant, after intercourse it can be a lot of discharge. She's very concerned. She did start using some new lubricant with intercourse because she was having some vaginal dryness and is not sure if that is causing her issues are not. She's having no pain with sex, no new sex partners, no urinary symptoms. No pelvic pain.  PERTINENT  PMH / PSH: I have reviewed the patient's medications, allergies, past medical and surgical history. Pertinent findings that relate to today's visit / issues include: Supra-Cervical hysterectomy. Reportedly she still has both ovaries.  Review of Systems See history of present illness.    Objective:   Physical Exam Vital signs are reviewed GEN.: Well-developed female no acute distress GU externally: Normal. No lesion, no rash. No vaginal atrophy is noted. Grade 1 cystocele. Cervix is normal in appearance. There is some thin yellow white vaginal discharge notable in the vaginal vault. On bimanual there is no uterus, there is retained cervix. No adnexal masses or tenderness. I do not feel the adnexa at all.       Assessment & Plan:

## 2015-01-15 NOTE — Assessment & Plan Note (Signed)
Discussed in detail what I think this is. She is reassured because she was afraid she had an infection or some other type of Pap I'll do. I discussed with her that there are several treatments for this and none of them are perfect. We will start her on clindamycin vaginal tabs once daily for 4 weeks. After that she'll stop in cine back in about 6 weeks. See patient instructions for additional details.  If she has problems with the clindamycin vaginal tabs, I would switch to 10% hydrocortisone vaginal tabs. These can be compounded or you can use half of a rectal suppository that is cortisone. I did do GC Chlamydia and wet prep today they're pending.  Greater than 50% of our 45 minute office visit was spent in counseling and education regarding these issues.

## 2015-01-15 NOTE — Telephone Encounter (Signed)
Pt wanted Dr. Jennette Kettle to know that she was unable to fill her Rx today as it was going to cost her around $1,000. She would like to know what else could be done. Please contact patient at earliest convenience. Thank you, [name], ASA

## 2015-01-15 NOTE — Patient Instructions (Signed)
The vaginal discharge you are experiencing is not a result of a yeast infection or bacterial infection. I did send off some swabs to make sure that you don't have chlamydia or gonorrhea, this is been checked before and I doubt that this is the cause. I suspect your vaginal discharge is hormonally related. The average age of menopause is 32. Menopause affects many things other than just a menstrual cycle. Since you have had a hysterectomy, it is probably not apparent to you that you're going through menopause.  I suspect the vaginal discharge she would have is related to hormonal changes. If this is true, it may be difficult to get it to resolve. There are several treatments, none of which are 100% effective so we may have to try one treatment or another. The first one I would recommend is a vaginal suppository of clindamycin once at night for the next 4-6 weeks. I would like to see you back in the office in [facility] in 4-6 weeks (please schedule with Dr Jennette Kettle in [facility] clinic). Please make this appointment with Dr. Jennette Kettle in about 4-6 weeks. During the treatment time, he will continue to have the discharge and in fact it may be a little bit increased because now you are  putting a vaginal suppository in there as well. This does not concern me if the discharge increases. You should not have any bleeding, pain or fever or other new symptoms. If you do, please see me back in clinic or call.

## 2015-01-16 LAB — CERVICOVAGINAL ANCILLARY ONLY
Chlamydia: NEGATIVE
NEISSERIA GONORRHEA: NEGATIVE

## 2015-01-16 MED ORDER — HYDROCORTISONE ACETATE 25 MG RE SUPP: RECTAL | Status: DC

## 2015-01-16 NOTE — Telephone Encounter (Signed)
Dear Cliffton Asters Team OK--I am going t o try something else---I have put a rx AND some coupons up front in envelope. It is for a vaginal suppository--ot is DISPENSED as a RECTAL sup[pository but I want her using it in VAGINA (it is same medicine but cheaper this way) Please let her know THANKS! Denny Levy

## 2015-01-16 NOTE — Telephone Encounter (Signed)
Contacted pt and informed her of below and she requested that I mail it to her due to her work schedule and I told her I would, placed at 2:00pm on 01/16/15. Lamonte Sakai, April D, [place]

## 2015-01-20 ENCOUNTER — Encounter: Payer: Self-pay | Admitting: Family Medicine

## 2015-10-01 ENCOUNTER — Telehealth (HOSPITAL_COMMUNITY): Payer: Self-pay | Admitting: *Deleted

## 2015-10-01 NOTE — Telephone Encounter (Signed)
Telephoned patient at home # and left message to return call to [facility] 

## 2015-10-20 ENCOUNTER — Other Ambulatory Visit: Payer: Self-pay | Admitting: Obstetrics and Gynecology

## 2015-10-20 DIAGNOSIS — Z1231 Encounter for screening mammogram for malignant neoplasm of breast: Secondary | ICD-10-CM

## 2015-10-30 ENCOUNTER — Emergency Department (HOSPITAL_BASED_OUTPATIENT_CLINIC_OR_DEPARTMENT_OTHER)
Admission: EM | Admit: 2015-10-30 | Discharge: 2015-10-30 | Disposition: A | Discharge status: Home / Self Care | Payer: BLUE CROSS/BLUE SHIELD | Attending: Emergency Medicine | Admitting: Emergency Medicine

## 2015-10-30 ENCOUNTER — Ambulatory Visit (HOSPITAL_COMMUNITY): Admission: RE | Admit: 2015-10-30 | Payer: BLUE CROSS/BLUE SHIELD | Source: Ambulatory Visit

## 2015-10-30 ENCOUNTER — Encounter (HOSPITAL_BASED_OUTPATIENT_CLINIC_OR_DEPARTMENT_OTHER): Payer: Self-pay | Admitting: Emergency Medicine

## 2015-10-30 DIAGNOSIS — Y9389 Activity, other specified: Secondary | ICD-10-CM | POA: Insufficient documentation

## 2015-10-30 DIAGNOSIS — Y929 Unspecified place or not applicable: Secondary | ICD-10-CM | POA: Diagnosis not present

## 2015-10-30 DIAGNOSIS — S161XXA Strain of muscle, fascia and tendon at neck level, initial encounter: Secondary | ICD-10-CM | POA: Insufficient documentation

## 2015-10-30 DIAGNOSIS — S3992XA Unspecified injury of lower back, initial encounter: Secondary | ICD-10-CM | POA: Diagnosis present

## 2015-10-30 DIAGNOSIS — S39012A Strain of muscle, fascia and tendon of lower back, initial encounter: Secondary | ICD-10-CM | POA: Insufficient documentation

## 2015-10-30 DIAGNOSIS — Y999 Unspecified external cause status: Secondary | ICD-10-CM | POA: Diagnosis not present

## 2015-10-30 MED ORDER — NAPROXEN 375 MG PO TABS: 375.0000 mg | ORAL_TABLET | Freq: Two times a day (BID) | ORAL | Status: DC

## 2015-10-30 MED ORDER — ORPHENADRINE CITRATE ER 100 MG PO TB12: 100.0000 mg | ORAL_TABLET | Freq: Two times a day (BID) | ORAL | Status: DC
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
## 2015-10-30 NOTE — Discharge Instructions (Signed)
Muscle Strain  A muscle strain is an injury that occurs when a muscle is stretched beyond its normal length. Usually a small number of muscle fibers are torn when this happens. Muscle strain is rated in degrees. First-degree strains have the least amount of muscle fiber tearing and pain. Second-degree and third-degree strains have increasingly more tearing and pain.   Usually, recovery from muscle strain takes 1-2 weeks. Complete healing takes 5-6 weeks.   CAUSES   Muscle strain happens when a sudden, violent force placed on a muscle stretches it too far. This may occur with lifting, sports, or a fall.   RISK FACTORS  Muscle strain is especially common in athletes.   SIGNS AND SYMPTOMS  At the site of the muscle strain, there may be:   Pain.   Bruising.   Swelling.   Difficulty using the muscle due to pain or lack of normal function.  DIAGNOSIS   Your health care provider will perform a physical exam and ask about your medical history.  TREATMENT   Often, the best treatment for a muscle strain is resting, icing, and applying cold compresses to the injured area.   HOME CARE INSTRUCTIONS    Use the PRICE method of treatment to promote muscle healing during the first 2-3 days after your injury. The PRICE method involves:    Protecting the muscle from being injured again.    Restricting your activity and resting the injured body part.    Icing your injury. To do this, put ice in a plastic bag. Place a towel between your skin and the bag. Then, apply the ice and leave it on from 15-20 minutes each hour. After the third day, switch to moist heat packs.    Apply compression to the injured area with a splint or elastic bandage. Be careful not to wrap it too tightly. This may interfere with blood circulation or increase swelling.    Elevate the injured body part above the level of your heart as often as you can.   Only take over-the-counter or prescription medicines for pain, discomfort, or fever as directed by your  health care provider.   Warming up prior to exercise helps to prevent future muscle strains.  SEEK MEDICAL CARE IF:    You have increasing pain or swelling in the injured area.   You have numbness, tingling, or a significant loss of strength in the injured area.  MAKE SURE YOU:    Understand these instructions.   Will watch your condition.   Will get help right away if you are not doing well or get worse.     This information is not intended to replace advice given to you by your health care provider. Make sure you discuss any questions you have with your health care provider.     Document Released: 06/07/2005 Document Revised: 03/28/2013 Document Reviewed: 01/04/2013  Elsevier Interactive Patient Education 2016 [employer]  Motor Vehicle Collision  It is common to have multiple bruises and sore muscles after a motor vehicle collision (MVC). These tend to feel worse for the first 24 hours. You may have the most stiffness and soreness over the first several hours. You may also feel worse when you wake up the first morning after your collision. After this point, you will usually begin to improve with each day. The speed of improvement often depends on the severity of the collision, the number of injuries, and the location and nature of these injuries.  HOME CARE INSTRUCTIONS     Put ice on the injured area.    Put ice in a plastic bag.    Place a towel between your skin and the bag.    Leave the ice on for 15-20 minutes, 3-4 times a day, or as directed by your health care provider.   Drink enough fluids to keep your urine clear or pale yellow. Do not drink alcohol.   Take a warm shower or bath once or twice a day. This will increase blood flow to sore muscles.   You may return to activities as directed by your caregiver. Be careful when lifting, as this may aggravate neck or back pain.   Only take over-the-counter or prescription medicines for pain, discomfort, or fever as directed by your caregiver. Do  not use aspirin. This may increase bruising and bleeding.  SEEK IMMEDIATE MEDICAL CARE IF:   You have numbness, tingling, or weakness in the arms or legs.   You develop severe headaches not relieved with medicine.   You have severe neck pain, especially tenderness in the middle of the back of your neck.   You have changes in bowel or bladder control.   There is increasing pain in any area of the body.   You have shortness of breath, light-headedness, dizziness, or fainting.   You have chest pain.   You feel sick to your stomach (nauseous), throw up (vomit), or sweat.   You have increasing abdominal discomfort.   There is blood in your urine, stool, or vomit.   You have pain in your shoulder (shoulder strap areas).   You feel your symptoms are getting worse.  MAKE SURE YOU:   Understand these instructions.   Will watch your condition.   Will get help right away if you are not doing well or get worse.     This information is not intended to replace advice given to you by your health care provider. Make sure you discuss any questions you have with your health care provider.     Document Released: 06/07/2005 Document Revised: 06/28/2014 Document Reviewed: 11/04/2010  Elsevier Interactive Patient Education 2016 Elsevier Inc.

## 2015-11-05 ENCOUNTER — Ambulatory Visit (INDEPENDENT_AMBULATORY_CARE_PROVIDER_SITE_OTHER): Payer: BLUE CROSS/BLUE SHIELD | Admitting: Student

## 2015-11-05 ENCOUNTER — Encounter: Payer: Self-pay | Admitting: Student

## 2015-11-05 DIAGNOSIS — M545 Low back pain: Secondary | ICD-10-CM

## 2015-11-05 NOTE — Patient Instructions (Signed)
Follow up as needed If you have worsening pain, questions or concerns, call the office at [phone]
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
## 2015-11-13 ENCOUNTER — Ambulatory Visit (HOSPITAL_COMMUNITY)
Admission: RE | Admit: 2015-11-13 | Discharge: 2015-11-13 | Disposition: A | Discharge status: Home / Self Care | Payer: Self-pay | Source: Ambulatory Visit | Attending: Obstetrics and Gynecology | Admitting: Obstetrics and Gynecology

## 2015-11-13 ENCOUNTER — Encounter (HOSPITAL_COMMUNITY): Payer: Self-pay

## 2015-11-13 ENCOUNTER — Ambulatory Visit
Admission: RE | Admit: 2015-11-13 | Discharge: 2015-11-13 | Disposition: A | Discharge status: Home / Self Care | Payer: No Typology Code available for payment source | Source: Ambulatory Visit | Attending: Obstetrics and Gynecology | Admitting: Obstetrics and Gynecology

## 2015-11-13 VITALS — BP 118/78 | Temp 98.4°F | Ht 60.0 in | Wt 180.2 lb

## 2015-11-13 DIAGNOSIS — Z1231 Encounter for screening mammogram for malignant neoplasm of breast: Secondary | ICD-10-CM

## 2015-11-13 DIAGNOSIS — Z1239 Encounter for other screening for malignant neoplasm of breast: Secondary | ICD-10-CM

## 2015-11-13 NOTE — Progress Notes (Signed)
No complaints today.   Pap Smear: Pap smear not completed today. [facility] and normal with negative HPV. Per patient has no history of an abnormal Pap smear. Patient has a history of a hysterectomy 12/04/2004 for fibroids. Patient no longer needs to have Pap smears completed due to her history of a hysterectomy for benign reasons per [facility] and ACOG guidelines. Last Pap smear result is in EPIC.  Physical exam: Breasts Breasts symmetrical. No skin abnormalities bilateral breasts. No nipple retraction bilateral breasts. No nipple discharge bilateral breasts. No lymphadenopathy. No lumps palpated right breast. Palpated a lump within the left breast inner breast at 11 o'clock 14 cm from the nipple. This lump was checked 02/06/2009 and per patient is unchanged. No complaints of pain or tenderness on exam.  [facility]. Appointment scheduled for Thursday, Nov 13, 2015 at 1440.       Pelvic/Bimanual No Pap smear completed today since patient has history of a hysterectomy for benign reasons. Pap smear not indicated per [facility] guidelines.   Smoking History: Patient has never smoked.  Patient Navigation: Patient education provided. Access to services provided for [facility] through [facility]. [facility] for assessment and to receive resources to establish care with a PCP.   Colorectal Cancer Screening: Patient has never had a colonoscopy.No complaints today.

## 2015-11-13 NOTE — Patient Instructions (Addendum)
Educational materials on self breast awareness given. Explained to Jackie Holden that she did not need a Pap smear today due to her history of a hysterectomy for benign reasons. Informed patient that she doesn't need any further Pap smears due to her history of a hysterectomy for benign reasons and no history of an abnormal Pap smear. [facility] mamm[facility]. Appointment scheduled for Thursday, Nov 13, 2015 at 1440. Jackie Holden verbalized understanding.  Emara Lichter, Kathaleen Maserhristine Poll, RN 2:53 PM

## 2015-11-21 ENCOUNTER — Encounter (HOSPITAL_COMMUNITY): Payer: Self-pay | Admitting: *Deleted

## 2015-11-27 ENCOUNTER — Telehealth: Payer: Self-pay

## 2015-11-27 NOTE — Telephone Encounter (Signed)
Called to schedule [facility]. Left message to return call on voice mail.

## 2015-12-01 ENCOUNTER — Ambulatory Visit: Payer: BLUE CROSS/BLUE SHIELD

## 2015-12-01 ENCOUNTER — Ambulatory Visit: Payer: No Typology Code available for payment source

## 2015-12-01 VITALS — BP 160/92 | HR 60 | Temp 97.9°F | Resp 18 | Ht 61.0 in | Wt 181.8 lb

## 2015-12-01 DIAGNOSIS — Z0000 Encounter for general adult medical examination without abnormal findings: Secondary | ICD-10-CM

## 2015-12-01 NOTE — Progress Notes (Signed)
Patient is a [facility] and is currently a [facility] patient effective 11/13/2015.   Clinical Measurements: Patient is 5 ft. 1 inch, weight 181.8 lbs, and BMI 34.4. Discussed changeable and non changeable risk factors for hypertension.  Medical History: Patient has no history of high cholesterol. Patient does not have a history of hypertension or diabetes. Per patient no diagnosed history of coronary heart disease, heart attack, heart failure, stroke/TIA, vascular disease or congenital heart defects.   Blood Pressure, Self-measurement: Patient states has no reason to check Blood pressure.  Nutrition Assessment: Patient stated that eats 2 to 3 fruits everyday. Patient states she eats 1 cup of vegetables a day. Per patient does not eat 3 or more ounces of whole grains daily. Patient does eat two or more servings of fish weekly. Patient states that eats tuna for lunch everyday. Patient states she does not drink more than 36 ounces or 450 calories of beverages with added sugars weekly. Patient stated she does watch her salt intake.   Physical Activity Assessment: Patient stated she has just gotten back to work at a new job which involves a lot of walking and walks 60 minutes at track when off. Patient stated has probably been doing 960 minutes of moderate exercise a week and no vigorous exercise.  Smoking Status: Patient stated that has never smoked and is not exposed to smoke.  Quality of Life Assessment: In assessing patient's physical quality of life she stated that out of the past 30 days that she has felt her health was good all days. Patient also stated that in the past 30 days that her mental health was good including stress, depression and problems with emotions for all days. Patient did state that out of the past 30 days she felt her physical or mental health had not kept her from doing her usual activities including self-care, work or recreation for all days.   Plan: Lab  work will be done today including a lipid panel and Hgb A1C. Will call lab results when they are finished. Patient will receive Health Coaching for risk reduction initially and then as patient wants.

## 2015-12-01 NOTE — Patient Instructions (Signed)
Will work on her risk factors for hypertension. Discussed health assessment with patient. She will be called with results of lab work and we will then discussed any further follow up the patient needs. Patient verbalized understanding.

## 2015-12-02 LAB — HEMOGLOBIN A1C
Est. average glucose Bld gHb Est-mCnc: 105 mg/dL
Hemoglobin A1c: 5.3 % (ref 4.8–5.6)

## 2015-12-02 LAB — LIPID PANEL
CHOL/HDL RATIO: 5 ratio — AB (ref 0.0–4.4)
CHOLESTEROL TOTAL: 179 mg/dL (ref 100–199)
HDL: 36 mg/dL — ABNORMAL LOW (ref >39)
LDL CALC: 136 mg/dL — AB (ref 0–99)
Triglycerides: 36 mg/dL (ref 0–149)
VLDL Cholesterol Cal: 7 mg/dL (ref 5–40)

## 2015-12-08 ENCOUNTER — Telehealth: Payer: Self-pay

## 2015-12-08 NOTE — Telephone Encounter (Signed)
Attempted to call lab results for second time and nothing answered the phone.

## 2015-12-09 ENCOUNTER — Telehealth: Payer: Self-pay

## 2015-12-09 NOTE — Telephone Encounter (Addendum)
LAB RESULTS: Patient returned call to be informed about lab work from 12/05/15. I informed patient: cholesterol- 179, HDL- 36, LDL- 136, triglycerides - 36,  HBG-A1C - 5.3, and BMI 34.4.  DOCTOR's APPOINTMENT: [facility] at 3:15 PM for elevated blood Pressure. Informed that I am required to send her because of her readings even though was just there in May. Facility address and telephone number given.   SECOND HEALTH COACHING: Did risk reduction Heach Coach concerning to BMI/Weight, HDL's and LDL's. Patient and I discussed her eating and exercise. Discussed serving sizes and reminded her of her WISEWOMAN overview sheet that received at screening. WISEWOMAN sheet has all food group minimal servings required for day for1600 calorie diet. Discussed if wanted could do [facility] program for hypertension. Discussed omega 3's, supplements and using good oil. Informed that needed to increase fiber in diet.   NAVIGATION NEEDS ASSESSMENT AND CARE PLAN: Patient does need additional social support and lack of access to services.Patient understands why she has to be followed up for blood pressure. Only barrier may have been that patient is not aware of some services to assist the low income. Marland Kitchen.  PLAN: Patient will attend doctor's appointment. Will call patient back after appointment to see how appointment went. Patient will set goal. Patient and I will discuss more health coaching concerning patients exercise and weight loss goals

## 2015-12-11 NOTE — Progress Notes (Addendum)
This health Coaching originated on December 01, 2015 FIRST HEALTH COACH: During initial screening on June 12 wellness health coaching was started. This included the following:  Blood Pressure: Blood pressure readings were 160/92 and 150/88. Readings were in the hypertensive to pre-hypertensive range. Plus, I had noticed that BP had been in pre-hypertensive range last few visits to clinic.Informed that would have to send to doctor. Discussed non changeable and changeable risk factors with patient. Discussed changeable risk factors specific to patient. These included overweight and stress.   Nutrition: Discussed with patient that normally should eat 2 to 3 servings of fruits and vegetables a day. Informed patient that two servings of fish were recommended per week. Listed the fishes with omega three's for patient. Discussed that should have 5 to 7 ounces of fiber a day. Explained to patient that should eat fruits and vegetables high in fiber but 3 of her 5 to 7 grains of fiber should be whole grain. Examples of whole grains were given.   Informed about sugar and salt which patient is doing.   Physical Activity:  Patient was Informed that minimal moderate activity is 150 minutes a week or 90 minutes of vigorous. Patient is doing her minimal moderate exercise. Discussed adding in some flexeibily and balance exercises.  PLAN: Will set goal when call lab results and do second health coaching. Will think about what goals may want to set.

## 2015-12-12 ENCOUNTER — Telehealth: Payer: Self-pay

## 2015-12-12 NOTE — Telephone Encounter (Signed)
Patient called to let me know that got the message about her doctor's appointment and that she would call me after the appointment.

## 2015-12-15 ENCOUNTER — Ambulatory Visit (INDEPENDENT_AMBULATORY_CARE_PROVIDER_SITE_OTHER): Payer: BLUE CROSS/BLUE SHIELD | Admitting: Family Medicine

## 2015-12-15 ENCOUNTER — Encounter: Payer: Self-pay | Admitting: Family Medicine

## 2015-12-15 VITALS — BP 160/85 | HR 66 | Temp 98.1°F | Ht 61.0 in | Wt 179.6 lb

## 2015-12-15 DIAGNOSIS — I10 Essential (primary) hypertension: Secondary | ICD-10-CM | POA: Diagnosis not present

## 2015-12-15 DIAGNOSIS — E669 Obesity, unspecified: Secondary | ICD-10-CM

## 2015-12-15 DIAGNOSIS — E784 Other hyperlipidemia: Secondary | ICD-10-CM

## 2015-12-15 DIAGNOSIS — E785 Hyperlipidemia, unspecified: Secondary | ICD-10-CM

## 2015-12-15 LAB — BASIC METABOLIC PANEL WITH GFR
BUN: 15 mg/dL (ref 7–25)
CALCIUM: 9.1 mg/dL (ref 8.6–10.4)
CO2: 27 mmol/L (ref 20–31)
Chloride: 105 mmol/L (ref 98–110)
Creat: 0.76 mg/dL (ref 0.50–1.05)
GFR, Est African American: >89 mL/min (ref >=60)
Glucose, Bld: 85 mg/dL (ref 65–99)
Potassium: 4.3 mmol/L (ref 3.5–5.3)
SODIUM: 140 mmol/L (ref 135–146)

## 2015-12-15 MED ORDER — HYDROCHLOROTHIAZIDE 12.5 MG PO CAPS: 12.5000 mg | ORAL_CAPSULE | Freq: Every day | ORAL | Status: DC

## 2015-12-15 NOTE — Assessment & Plan Note (Signed)
Recent lipid panel. ASCVD 10 yr risk score calculated, if using BP 160 and "not treated for HTN" score 8.1%, without DM would not initiate statin therapy today, if use a controlled BP reading and "treated for HTN" then < 7%. Will follow-up ASCVD risk score in future once BP control improves on medication. Given patience's hesitancy with medications, will start with anti-HTN today, would benefit from detailed discussion of statins in future. - Recommended to start ASA 81mg  daily

## 2015-12-15 NOTE — Assessment & Plan Note (Signed)
Persistent elevated BP, >160 SBP on re-check. Uncontrolled, no medication. No known complications. Significant family history with CAD, HTN.  Plan: 1. Discussed importance of adequately treating HTN chronically with both lifestyle and medications given her history of persistent elevated BP. 2. Start HCTZ 12.5mg  daily 3. Check BMET, follow Cr for monitoring no recent labs to compare 4. Lifestyle modifications - increase regular scheduled walking exercise to 3-5 days weekly, reduce sodium in diet read food labels, reduced processed foods 5. Check BP outside office, record readings, if persistent elevated follow-up sooner within 2-4 weeks, otherwise if stable can follow-up within 1-3 months, anticipate will likely need titrate up HCTZ to 25mg  daily, then would consider adding Amlodipine vs ACEi next agent

## 2015-12-15 NOTE — Patient Instructions (Addendum)
Thank you for coming in to clinic today.  1. You have had chronic elevated BP for several years now. As discussed, we will start medication and try to start some more lifestyle changes - Start taking Hydrochlorothiazide 12.5mg  daily (this is lowest dose) 1 pill every day same time. Limited significant side effects, it may make you urinate a little more, mild fluid pill - Check BP outside office at drug store / pharmacy or get electronic cuff to check at home, write down your numbers, several times a week for next few weeks, call us within next 1 month if BP is persistently >150/90, and we may need to double your dose to 2 pills (in future can send different rx with stronger strength so only taking 1 pill)  Recommend starting baby aspirin 81mg  daily (over the counter generic brand) to reduce future risk of heart attack or stroke  Try to start walking for 30min to 1 hour at least 3-5 times a week. Work on some form of exercise on days that you are working.   The 5 Minute Rule of Exercise - Promise yourself to at least do 5 minutes of exercise (make sure you time it), and if at the end of 5 minutes (this is the hardest part of the work-out), if you still feel like you want stop (or not motivated to continue) then allow yourself to stop. Otherwise, more often than not you will feel encouraged that you can continue for a little while longer or even more!   Low-Sodium Eating Plan Sodium raises blood pressure and causes water to be held in the body. Getting less sodium from food will help lower your blood pressure, reduce any swelling, and protect your heart, liver, and kidneys. We get sodium by adding salt (sodium chloride) to food. Most of our sodium comes from canned, boxed, and frozen foods. Restaurant foods, fast foods, and pizza are also very high in sodium. Even if you take medicine to lower your blood pressure or to reduce fluid in your body, getting less sodium from your food is important. WHAT IS  MY PLAN? Most people should limit their sodium intake to 2,300 mg a day. Your health care provider recommends that you limit your sodium intake to __________ a day.  WHAT DO I NEED TO KNOW ABOUT THIS EATING PLAN? For the low-sodium eating plan, you will follow these general guidelines:  Choose foods with a % Daily Value for sodium of less than 5% (as listed on the food label).   Use salt-free seasonings or herbs instead of table salt or sea salt.   Check with your health care provider or pharmacist before using salt substitutes.   Eat fresh foods.  Eat more vegetables and fruits.  Limit canned vegetables. If you do use them, rinse them well to decrease the sodium.   Limit cheese to 1 oz (28 g) per day.   Eat lower-sodium products, often labeled as "lower sodium" or "no salt added."  Avoid foods that contain monosodium glutamate (MSG). MSG is sometimes added to [place] food and some canned foods.  Check food labels (Nutrition Facts labels) on foods to learn how much sodium is in one serving.  Eat more home-cooked food and less restaurant, buffet, and fast food.  When eating at a restaurant, ask that your food be prepared with less salt, or no salt if possible.  HOW DO I READ FOOD LABELS FOR SODIUM INFORMATION? The Nutrition Facts label lists the amount of sodium in one  serving of the food. If you eat more than one serving, you must multiply the listed amount of sodium by the number of servings. Food labels may also identify foods as:  Sodium free--Less than 5 mg in a serving.  Very low sodium--35 mg or less in a serving.  Low sodium--140 mg or less in a serving.  Light in sodium--50% less sodium in a serving. For example, if a food that usually has 300 mg of sodium is changed to become light in sodium, it will have 150 mg of sodium.  Reduced sodium--25% less sodium in a serving. For example, if a food that usually has 400 mg of sodium is changed to reduced sodium, it  will have 300 mg of sodium. WHAT FOODS CAN I EAT? Grains Low-sodium cereals, including oats, puffed wheat and rice, and shredded wheat cereals. Low-sodium crackers. Unsalted rice and pasta. Lower-sodium bread.  Vegetables Frozen or fresh vegetables. Low-sodium or reduced-sodium canned vegetables. Low-sodium or reduced-sodium tomato sauce and paste. Low-sodium or reduced-sodium tomato and vegetable juices.  Fruits Fresh, frozen, and canned fruit. Fruit juice.  Meat and Other Protein Products Low-sodium canned tuna and salmon. Fresh or frozen meat, poultry, seafood, and fish. Lamb. Unsalted nuts. Dried beans, peas, and lentils without added salt. Unsalted canned beans. Homemade soups without salt. Eggs.  Dairy Milk. Soy milk. Ricotta cheese. Low-sodium or reduced-sodium cheeses. Yogurt.  Condiments Fresh and dried herbs and spices. Salt-free seasonings. Onion and garlic powders. Low-sodium varieties of mustard and ketchup. Fresh or refrigerated horseradish. Lemon juice.  Fats and Oils Reduced-sodium salad dressings. Unsalted butter.  Other Unsalted popcorn and pretzels.  The items listed above may not be a complete list of recommended foods or beverages. Contact your dietitian for more options. WHAT FOODS ARE NOT RECOMMENDED? Grains Instant hot cereals. Bread stuffing, pancake, and biscuit mixes. Croutons. Seasoned rice or pasta mixes. Noodle soup cups. Boxed or frozen macaroni and cheese. Self-rising flour. Regular salted crackers. Vegetables Regular canned vegetables. Regular canned tomato sauce and paste. Regular tomato and vegetable juices. Frozen vegetables in sauces. Salted [place] fries. Olives. Rosita Fire. Relishes. Sauerkraut. Salsa. Meat and Other Protein Products Salted, canned, smoked, spiced, or pickled meats, seafood, or fish. Bacon, ham, sausage, hot dogs, corned beef, chipped beef, and packaged luncheon meats. Salt pork. Jerky. Pickled herring. Anchovies, regular  canned tuna, and sardines. Salted nuts. Dairy Processed cheese and cheese spreads. Cheese curds. Blue cheese and cottage cheese. Buttermilk.  Condiments Onion and garlic salt, seasoned salt, table salt, and sea salt. Canned and packaged gravies. Worcestershire sauce. Tartar sauce. Barbecue sauce. Teriyaki sauce. Soy sauce, including reduced sodium. Steak sauce. Fish sauce. Oyster sauce. Cocktail sauce. Horseradish that you find on the shelf. Regular ketchup and mustard. Meat flavorings and tenderizers. Bouillon cubes. Hot sauce. Tabasco sauce. Marinades. Taco seasonings. Relishes. Fats and Oils Regular salad dressings. Salted butter. Margarine. Ghee. Bacon fat.  Other Potato and tortilla chips. Corn chips and puffs. Salted popcorn and pretzels. Canned or dried soups. Pizza. Frozen entrees and pot pies.  The items listed above may not be a complete list of foods and beverages to avoid. Contact your dietitian for more information.   This information is not intended to replace advice given to you by your health care provider. Make sure you discuss any questions you have with your health care provider.   Document Released: 11/27/2001 Document Revised: 06/28/2014 Document Reviewed: 04/11/2013 Elsevier Interactive Patient Education 2016 Elsevier Inc.   Please schedule a follow-up appointment with Dr Kennon Rounds within  1-3 months for BP Follow-up, future will need to re-check cholesterol next year through screening program.  If you have any other questions or concerns, please feel free to call the clinic to contact me. You may also schedule an earlier appointment if necessary.  However, if your symptoms get significantly worse, please go to the Emergency Department to seek immediate medical attention.  Saralyn PilarAlexander Karamalegos, DO [facility]

## 2015-12-15 NOTE — Progress Notes (Signed)
Subjective:    Patient ID: Jackie Holden, female    DOB: 1961/05/07, 55 y.o.   MRN: [id number]  Jackie Minaenelope O Canavan is a 55 y.o. female presenting on 12/15/2015 for Elevated BP / Abnormal Screening [facility]  HPI   CHRONIC HTN: Reports [facility] for RN screening and found BP to be elevated up to 160/92, however stated no prior elevated BP. However, chart review shows several elevated BP readings up to 160s/90s over past 3 years and patient has declined all prior anti-HTN medications (concerned about side effects and also just does not like medications). She is considering starting med today. Current Meds - None   Lifestyle - Exercise - Walking 30 min to 1 hr about 1-3 x weekly, not every week, limited due to long work hours and prolonged standing at job. Diet - no special dietary plan, does eat a lot of processed and frozen foods - Family History concerning with both mother and father with MIs and CAD age 248-50+, also HTN on both sides of family. Denies CP, dyspnea, HA, edema, dizziness / lightheadedness  DYSLIPIDEMIA: - [facility], with low HDL 36 and elevated LDL 136, low normal TGs. Not currently on statin therapy. No other supplements. - Weight is stable recently, but BMI >30%tile   Family History  Problem Relation Age of Onset  . Diabetes Sister   . Hypertension Sister   . Diabetes Brother   . Hypertension Brother   . Stroke Brother   . Diabetes Brother   . Hypertension Father   . Hypertension Mother      Social History  Substance Use Topics  . Smoking status: Never Smoker   . Smokeless tobacco: Never Used  . Alcohol Use: Yes     Comment: ocassionally    Review of Systems Per HPI unless specifically indicated above     Objective:    BP 160/85 mmHg  Pulse 66  Temp(Src) 98.1 F (36.7 C) (Oral)  Ht 5\' 1"  (1.549 m)  Wt 179 lb 9.6 oz (81.466 kg)  BMI 33.95 kg/m2  SpO2 98%  Wt  Readings from Last 3 Encounters:  12/15/15 179 lb 9.6 oz (81.466 kg)  12/01/15 181 lb 12.8 oz (82.464 kg)  11/13/15 180 lb 3.2 oz (81.738 kg)    Physical Exam  Constitutional: She appears well-developed and well-nourished. No distress.  Well-appearing, comfortable, cooperative  HENT:  Head: Normocephalic and atraumatic.  Mouth/Throat: Oropharynx is clear and moist.  Neck: Normal range of motion. Neck supple. No thyromegaly present.  Cardiovascular: Normal rate, regular rhythm, normal heart sounds and intact distal pulses.   No murmur heard. Pulmonary/Chest: Effort normal and breath sounds normal.  Musculoskeletal: She exhibits no edema.  Neurological: She is alert.  Skin: Skin is warm and dry. She is not diaphoretic.  Nursing note and vitals reviewed.  Results for orders placed or performed in visit on 12/01/15  Hemoglobin A1c  Result Value Ref Range   Hemoglobin A1c 5.3 4.8 - 5.6 %   Est. average glucose Bld gHb Est-mCnc 105 mg/dL  Lipid panel  Result Value Ref Range   Cholesterol, Total 179 100 - 199 mg/dL   Triglycerides 36 0 - 149 mg/dL   HDL 36 (L) >40>39 mg/dL   VLDL Cholesterol Cal 7 5 - 40 mg/dL   LDL Calculated 981136 (H) 0 - 99 mg/dL   Chol/HDL Ratio 5.0 (H) 0.0 - 4.4 ratio units  Assessment & Plan:   Problem List Items Addressed This Visit    OBESITY, UNSPECIFIED    Stable weight. Concern with other risk factors with HTN, dyslipidemia, family history of CAD, MI. Counseling on exercise / diet lifestyle modifications      ESSENTIAL HYPERTENSION, BENIGN - Primary    Persistent elevated BP, >160 SBP on re-check. Uncontrolled, no medication. No known complications. Significant family history with CAD, HTN.  Plan: 1. Discussed importance of adequately treating HTN chronically with both lifestyle and medications given her history of persistent elevated BP. 2. Start HCTZ 12.5mg  daily 3. Check BMET, follow Cr for monitoring no recent labs to compare 4. Lifestyle  modifications - increase regular scheduled walking exercise to 3-5 days weekly, reduce sodium in diet read food labels, reduced processed foods 5. Check BP outside office, record readings, if persistent elevated follow-up sooner within 2-4 weeks, otherwise if stable can follow-up within 1-3 months, anticipate will likely need titrate up HCTZ to 25mg  daily, then would consider adding Amlodipine vs ACEi next agent      Relevant Medications   hydrochlorothiazide (MICROZIDE) 12.5 MG capsule   Other Relevant Orders   BASIC METABOLIC PANEL WITH GFR   Dyslipidemia (high LDL; low HDL)    Recent lipid panel. ASCVD 10 yr risk score calculated, if using BP 160 and "not treated for HTN" score 8.1%, without DM would not initiate statin therapy today, if use a controlled BP reading and "treated for HTN" then < 7%. Will follow-up ASCVD risk score in future once BP control improves on medication. Given patience's hesitancy with medications, will start with anti-HTN today, would benefit from detailed discussion of statins in future. - Recommended to start ASA 81mg  daily         Meds ordered this encounter  Medications  . hydrochlorothiazide (MICROZIDE) 12.5 MG capsule    Sig: Take 1 capsule (12.5 mg total) by mouth daily.    Dispense:  30 capsule    Refill:  2      Follow up plan: Return in about 4 weeks (around 01/12/2016) for blood pressure.  Saralyn PilarAlexander Karamalegos, DO [facility], PGY-3

## 2015-12-15 NOTE — Assessment & Plan Note (Signed)
Stable weight. Concern with other risk factors with HTN, dyslipidemia, family history of CAD, MI. Counseling on exercise / diet lifestyle modifications

## 2015-12-16 ENCOUNTER — Telehealth: Payer: Self-pay

## 2015-12-16 NOTE — Telephone Encounter (Signed)
Called to follow up on how doctors appointment went. Patient stated that started her on medication and patient sounds unsure about wanting to start it. Per patient went and got medication and stated that doctor and pharmacy explained medication and side effects to her. Doctor wants patient to monitor blood pressure so I explained the HEART [facility]ISE Program. Patient stated that it would be great to do the program. Explained that had to wait until State sends monitors.  PLAN: Start HEART WISE Program when monitors are in. Call patient's schedule back to office. Will make appointment when monitor's arrive.

## 2015-12-17 ENCOUNTER — Telehealth: Payer: Self-pay | Admitting: Family Medicine

## 2015-12-17 ENCOUNTER — Encounter: Payer: Self-pay | Admitting: Family Medicine

## 2015-12-17 NOTE — Telephone Encounter (Signed)
Last seen by me 12/15/15 for [facility] follow-up HTN, HLD checked screening labs with BMET. Results as follows:  1. BMET = Normal Results - Normal electrolytes, K 4.3, normal Cr 0.76, normal glucose 85. No prior history of DM, recent A1c 5.3  Attempted to call patient, no voicemail available for me to leave message on, if patient calls back please relay the following plan  Plan: - Normal BMET results, specifically normal kidney function, will mail copy of lab results to patient - Continue HCTZ 12.5mg  daily and monitor BP outside office - Low salt heart healthy diet, inc physical exercise - Follow-up as planned within 1-3 months, monitor BP sooner if needed  Saralyn PilarAlexander Karamalegos, DO [facility], PGY-3

## 2015-12-18 ENCOUNTER — Telehealth: Payer: Self-pay

## 2015-12-18 NOTE — Telephone Encounter (Signed)
Patient called to let know her work schedule so can do HEART WISE when BP monitors come in. Will be off 7/3, 7/4 and 7/7.

## 2016-01-01 ENCOUNTER — Telehealth: Payer: Self-pay

## 2016-01-01 NOTE — Telephone Encounter (Addendum)
Called to check on Heart [facility] Program. Informed patient that  The State's budget will not go into effect until August. So let know that it would be August before got monitors.  [facility]ACH SESSION: Patient stated that she was doing good. She was walking more late in the evenings. Per patient is slowly making changes in foods that she is eating. Patient stated that was really having trouble with potato chips. We discussed alternatives to salty chips. Patient stated that had to limit herself to 10 chips a day. Patient asked about going back to doctor. We discussed the other papers that I had given her were for eligibility. Informed patient that needed to call the office and make an appointment for eligibility with Annice PihJackie or Rudell Cobbeborah Hill. I gave patient the telephone number.  PLAN: Will call patient when BP monitors come in for the [facility]. Will call in two to four weeks for follow up assessment.  TIME: 15 minutes

## 2016-02-03 ENCOUNTER — Telehealth: Payer: Self-pay

## 2016-02-03 NOTE — Telephone Encounter (Signed)
Patient called for final assessment following health coaching.  ASSESSMENT :This is a follow up Assessment following [employer] [facility] . Marland Kitchen.  Medication Status : Patient states is not taking medication for high cholesterol or diabetes. Patient stated that takes HCTZ 12.5 mg for blood pressure.  Blood Pressure, Self-measurement: Patient states  checks Blood pressure at store when can. Patient voiced that wants to be in the [facility] when get monitors.  Nutrition Assessment: Patient stated that eats 2 to 3 fruits every day. Patient states she eats 2 servings of vegetables a day. Per Patient eats 3 or more ounces of whole grains daily. Patient stated does eat two or more servings of fish weekly. Patient states she does not drink more than 36 ounces or 450 calories of beverages with added sugars weekly. Patient stated she does watch her salt intake.   Physical Activity Assessment: Patient stated she does around 720 minutes of moderate and 90 minutes vigorous activity per week.  Smoking Status: Patient stated has never smoked and is not exposed to smoke.  Quality of Life Assessment: In assessing patient's Physical quality of life she stated that out of the past 30 days that she has felt her health was good days.  Patient also stated that in the past 30 days that her mental health was good including stress, depression and problems with emotions for all days. Patient did state that out of the past 30 days she felt her physical or mental health had not kept her from doing her usual activities including self-care, work or recreation.   PLAN: Informed patient that would re screen if does not have insurance when and if [facility].  TIME SPENT with PATIENT: 15 minutes

## 2016-02-04 ENCOUNTER — Other Ambulatory Visit: Payer: Self-pay | Admitting: *Deleted

## 2016-02-04 DIAGNOSIS — I10 Essential (primary) hypertension: Secondary | ICD-10-CM

## 2016-02-04 MED ORDER — HYDROCHLOROTHIAZIDE 12.5 MG PO CAPS: 12.5000 mg | ORAL_CAPSULE | Freq: Every day | ORAL | 2 refills | Status: DC

## 2016-02-04 NOTE — Telephone Encounter (Signed)
Refill request for 90 day supply.  Ankith Edmonston L, RN  

## 2016-08-09 ENCOUNTER — Ambulatory Visit (HOSPITAL_BASED_OUTPATIENT_CLINIC_OR_DEPARTMENT_OTHER)
Admission: RE | Admit: 2016-08-09 | Discharge: 2016-08-09 | Disposition: A | Discharge status: Home / Self Care | Payer: Managed Care, Other (non HMO) | Source: Ambulatory Visit | Attending: Family Medicine | Admitting: Family Medicine

## 2016-08-09 ENCOUNTER — Encounter: Payer: Self-pay | Admitting: Family Medicine

## 2016-08-09 ENCOUNTER — Ambulatory Visit (INDEPENDENT_AMBULATORY_CARE_PROVIDER_SITE_OTHER): Payer: Managed Care, Other (non HMO) | Admitting: Family Medicine

## 2016-08-09 VITALS — BP 132/93 | HR 80 | Ht 61.0 in | Wt 175.0 lb

## 2016-08-09 DIAGNOSIS — M542 Cervicalgia: Secondary | ICD-10-CM

## 2016-08-09 DIAGNOSIS — M47812 Spondylosis without myelopathy or radiculopathy, cervical region: Secondary | ICD-10-CM | POA: Insufficient documentation

## 2016-08-09 MED ORDER — DICLOFENAC SODIUM 75 MG PO TBEC: 75.0000 mg | DELAYED_RELEASE_TABLET | Freq: Two times a day (BID) | ORAL | 1 refills | Status: DC

## 2016-08-09 MED ORDER — METHOCARBAMOL 500 MG PO TABS: 500.0000 mg | ORAL_TABLET | Freq: Three times a day (TID) | ORAL | 1 refills | Status: DC | PRN

## 2016-08-09 NOTE — Patient Instructions (Addendum)
You have a trapezius strain and underlying arthritis of your neck. Take diclofenac twice a day with food for pain. Robaxin up to 3 times a day as needed for spasms. Start physical therapy and do home exercises/stretches on days you don't go to therapy. Heat as needed 15 minutes at a time 3-4 times a day. Follow up with me in 1 month to 6 weeks for reevaluation.

## 2016-08-11 DIAGNOSIS — G8929 Other chronic pain: Secondary | ICD-10-CM | POA: Insufficient documentation

## 2016-08-11 DIAGNOSIS — M542 Cervicalgia: Secondary | ICD-10-CM

## 2016-08-11 NOTE — Assessment & Plan Note (Signed)
2/2 trapezius strain.  Independently reviewed radiographs and has underlying degenerative changes as well.  She will start physical therapy, home exercises.  Diclofenac with robaxin as needed.  Heat as needed.  F/u in 1 month to 6 weeks for reevaluation.

## 2016-08-11 NOTE — Progress Notes (Signed)
PCP: Velora HecklerHaney,Alyssa, MD  Subjective:   HPI: Patient is a 56 y.o. female here for right neck/shoulder pain.  Patient reports about 1 1/2 weeks ago she woke up with a crick in the right side of her neck. Had sharp pain especially when turning to the right. Pain level 9/10 and sharp. Has taken aleve, an old pain medication. Now getting pain with rest also. Radiates only into right shoulder. 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 No skin changes, numbness.  No past medical history on file.  Current Outpatient Prescriptions on File Prior to Visit  Medication Sig Dispense Refill  . hydrochlorothiazide (MICROZIDE) 12.5 MG capsule Take 1 capsule (12.5 mg total) by mouth daily. 30 capsule 2  . hydrocortisone (ANUSOL-HC) 25 MG suppository Place one suppository in vagina at bedtime each night (Patient not taking: Reported on 11/13/2015) 28 suppository 1   No current facility-administered medications on file prior to visit.     Past Surgical History:  Procedure Laterality Date  . ABDOMINAL HYSTERECTOMY      No Known Allergies  Social History   Social History  . Marital status: Single    Spouse name: N/A  . Number of children: N/A  . Years of education: N/A   Occupational History  . Not on file.   Social History Main Topics  . Smoking status: Never Smoker  . Smokeless tobacco: Never Used  . Alcohol use Yes     Comment: ocassionally  . Drug use: Unknown  . Sexual activity: Not on file   Other Topics Concern  . Not on file   Social History Narrative  . No narrative on file    Family History  Problem Relation Age of Onset  . Diabetes Sister   . Hypertension Sister   . Diabetes Brother   . Hypertension Brother   . Stroke Brother   . Diabetes Brother   . Hypertension Father   . Hypertension Mother     BP (!) 132/93   Pulse 80   Ht 5\' 1"  (1.549 m)   Wt 175 lb (79.4 kg)   BMI 33.07 kg/m   Review of Systems: See HPI above.     Objective:   Physical Exam:  Gen: NAD, comfortable in exam room  Neck: No gross deformity, swelling, bruising. TTP right trapezius, cervical paraspinal region.  No midline/bony TTP. FROM neck - pain right lateral rotation. BUE strength 5/5. Sensation intact to light touch.   1+ equal reflexes in triceps, biceps, brachioradialis tendons. Negative spurlings. NV intact distal BUEs.  Right shoulder: No swelling, ecchymoses.  No gross deformity. No TTP. FROM without pain. Negative Hawkins, Neers. Negative Yergasons. Strength 5/5 with empty can and resisted internal/external rotation.  No pain Negative apprehension. NV intact distally.   Assessment & Plan:  1. Right neck pain - 2/2 trapezius strain.  Independently reviewed radiographs and has underlying degenerative changes as well.  She will start physical therapy, home exercises.  Diclofenac with robaxin as needed.  Heat as needed.  F/u in 1 month to 6 weeks for reevaluation.

## 2016-08-31 ENCOUNTER — Other Ambulatory Visit: Payer: Self-pay | Admitting: *Deleted

## 2016-08-31 DIAGNOSIS — I10 Essential (primary) hypertension: Secondary | ICD-10-CM

## 2016-08-31 MED ORDER — HYDROCHLOROTHIAZIDE 12.5 MG PO CAPS: 12.5000 mg | ORAL_CAPSULE | Freq: Every day | ORAL | 2 refills | Status: DC

## 2016-09-17 ENCOUNTER — Ambulatory Visit: Payer: No Typology Code available for payment source | Admitting: Family Medicine

## 2016-11-01 ENCOUNTER — Ambulatory Visit: Payer: No Typology Code available for payment source | Admitting: Family Medicine

## 2016-11-11 ENCOUNTER — Other Ambulatory Visit: Payer: Self-pay | Admitting: Family Medicine

## 2017-02-18 ENCOUNTER — Other Ambulatory Visit: Payer: Self-pay | Admitting: Family Medicine

## 2017-02-18 DIAGNOSIS — Z1231 Encounter for screening mammogram for malignant neoplasm of breast: Secondary | ICD-10-CM

## 2017-02-25 ENCOUNTER — Ambulatory Visit
Admission: RE | Admit: 2017-02-25 | Discharge: 2017-02-25 | Disposition: A | Discharge status: Home / Self Care | Payer: Managed Care, Other (non HMO) | Source: Ambulatory Visit | Attending: Family Medicine | Admitting: Family Medicine

## 2017-02-25 DIAGNOSIS — Z1231 Encounter for screening mammogram for malignant neoplasm of breast: Secondary | ICD-10-CM

## 2017-03-01 ENCOUNTER — Other Ambulatory Visit: Payer: Self-pay | Admitting: Family Medicine

## 2017-03-01 DIAGNOSIS — R928 Other abnormal and inconclusive findings on diagnostic imaging of breast: Secondary | ICD-10-CM

## 2017-03-07 ENCOUNTER — Ambulatory Visit
Admission: RE | Admit: 2017-03-07 | Discharge: 2017-03-07 | Disposition: A | Discharge status: Home / Self Care | Payer: Managed Care, Other (non HMO) | Source: Ambulatory Visit | Attending: Family Medicine | Admitting: Family Medicine

## 2017-03-07 DIAGNOSIS — R928 Other abnormal and inconclusive findings on diagnostic imaging of breast: Secondary | ICD-10-CM

## 2017-03-14 ENCOUNTER — Encounter: Payer: No Typology Code available for payment source | Admitting: Family Medicine

## 2017-04-01 ENCOUNTER — Encounter: Payer: Managed Care, Other (non HMO) | Admitting: Family Medicine

## 2017-04-11 ENCOUNTER — Encounter (HOSPITAL_COMMUNITY): Payer: Self-pay

## 2017-04-19 ENCOUNTER — Other Ambulatory Visit: Payer: Self-pay | Admitting: Family Medicine

## 2017-05-27 ENCOUNTER — Telehealth: Payer: Self-pay | Admitting: Family Medicine

## 2017-05-27 DIAGNOSIS — I10 Essential (primary) hypertension: Secondary | ICD-10-CM

## 2017-05-27 MED ORDER — HYDROCHLOROTHIAZIDE 12.5 MG PO CAPS: 12.5000 mg | ORAL_CAPSULE | Freq: Every day | ORAL | 0 refills | Status: DC

## 2017-05-27 NOTE — Telephone Encounter (Signed)
Refill request for HCTZ received for [facility]. Patient overdue for HTN follow up. Will given 1 month refill to allow patient to come in for appt, can she please be called to schedule.

## 2017-05-27 NOTE — Telephone Encounter (Signed)
LM for patient that she would need an appointment to follow up on her blood pressure.  Ok per [employer] in chart.  Bryli Mantey,CMA

## 2017-05-27 NOTE — Telephone Encounter (Signed)
VM was left on cell phone number provider in chart. Please assist in scheduling pt to see pcp when she returns call.

## 2017-06-29 ENCOUNTER — Other Ambulatory Visit: Payer: Self-pay | Admitting: Family Medicine

## 2017-06-29 DIAGNOSIS — I10 Essential (primary) hypertension: Secondary | ICD-10-CM

## 2017-06-29 NOTE — Telephone Encounter (Signed)
Patient is overdue for HTN follow up can she please be called to schedule appt. 30d  refill given

## 2017-06-30 NOTE — Telephone Encounter (Signed)
LM for patient to call back. Jazmin Hartsell,CMA  

## 2017-07-01 ENCOUNTER — Encounter: Payer: Self-pay | Admitting: *Deleted

## 2017-07-01 NOTE — Telephone Encounter (Signed)
Letter mailed to patient. Geofrey Silliman,CMA  

## 2017-07-21 ENCOUNTER — Other Ambulatory Visit: Payer: Self-pay

## 2017-07-21 ENCOUNTER — Encounter: Payer: Self-pay | Admitting: Family Medicine

## 2017-07-21 ENCOUNTER — Ambulatory Visit (INDEPENDENT_AMBULATORY_CARE_PROVIDER_SITE_OTHER): Payer: Managed Care, Other (non HMO) | Admitting: Family Medicine

## 2017-07-21 VITALS — BP 116/79 | HR 65 | Temp 97.9°F | Wt 192.0 lb

## 2017-07-21 DIAGNOSIS — I10 Essential (primary) hypertension: Secondary | ICD-10-CM | POA: Diagnosis not present

## 2017-07-21 DIAGNOSIS — E785 Hyperlipidemia, unspecified: Secondary | ICD-10-CM | POA: Diagnosis not present

## 2017-07-21 DIAGNOSIS — Z6836 Body mass index (BMI) 36.0-36.9, adult: Secondary | ICD-10-CM

## 2017-07-21 DIAGNOSIS — M542 Cervicalgia: Secondary | ICD-10-CM | POA: Diagnosis not present

## 2017-07-21 DIAGNOSIS — E669 Obesity, unspecified: Secondary | ICD-10-CM | POA: Diagnosis not present

## 2017-07-21 DIAGNOSIS — Z1211 Encounter for screening for malignant neoplasm of colon: Secondary | ICD-10-CM

## 2017-07-21 DIAGNOSIS — G8929 Other chronic pain: Secondary | ICD-10-CM

## 2017-07-21 DIAGNOSIS — E66812 Obesity, class 2: Secondary | ICD-10-CM

## 2017-07-21 NOTE — Progress Notes (Signed)
    Subjective:  Jackie Holden is a 57 y.o. female who [facility] for blood pressure follow up  HPI:  HTN  On HCTZ 12.5mg  qd, tolerating well, compliant ROS: taking medications as instructed, no medication side effects noted, no TIA's, no chest pain on exertion, no dyspnea on exertion, no swelling of ankles and no orthostatic dizziness or lightheadedness.  New concerns: none.    Chronic neck pain Long standing chronic neck pain.  Has seen sports medicine for this in the past she was told at that time that it was secondary to arthritis from overuse.  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  She has stiffness and soreness in the morning that last about 30 minutes and also has lower back knee and hands pain she takes over-the-counter medication for this very rarely she does have relief with diclofenac.  She is able to perform all of her ADLs and her job despite this pain. No numbness, tingling, weakness, swelling.    Obesity Concerned about weight gain.  Feels that she needs to eat healthier. States that she has a busy work schedule and the only day that she can exercise a Saturday.  She does not exercise at all currently.   ROS: Per HPI  PSH: Hysterectomy   Objective:  Physical Exam: BP 116/79   Pulse 65   Temp 97.9 F (36.6 C) (Oral)   Wt 192 lb (87.1 kg)   SpO2 99%   BMI 36.28 kg/m   Gen: NAD, resting comfortably Neck: supple, normal ROM, no lymphadenopathy  CV: RRR with no murmurs appreciated Pulm: NWOB, CTAB with no crackles, wheezes, or rhonchi GI: Normal bowel sounds present. Soft, Nontender, Nondistended. MSK: no edema, cyanosis, or clubbing noted Skin: warm, dry Neuro: grossly normal, moves all extremities Psych: Normal affect and thought content    Assessment/Plan:  ESSENTIAL HYPERTENSION, BENIGN Stable and well controlled at goal. Continue hctz 12.5mg  qd. Check BMP today.   OBESITY,  UNSPECIFIED Patient asking for return appointment to discuss more about heathy eating for weight loss. Counseled on exercise but she states that she cannot find for this due to her busy schedule. Check lipid panel with direct LDL since patient ate breakfast today.   Chronic neck pain Has some mild degenerative changes on c spine xray on 08/09/16. Patient is very functional despite her pain with minimal OTC pain medication use. No red flags on history or exam. Patient reassured.   Leland HerElsia J Williard Keller, DO PGY-2, [facility] 07/21/2017 9:35 AM

## 2017-07-21 NOTE — Assessment & Plan Note (Signed)
Stable and well controlled at goal. Continue hctz 12.5mg  qd. Check BMP today.

## 2017-07-21 NOTE — Assessment & Plan Note (Signed)
Has some mild degenerative changes on c spine xray on 08/09/16. Patient is very functional despite her pain with minimal OTC pain medication use. No red flags on history or exam. Patient reassured.

## 2017-07-21 NOTE — Assessment & Plan Note (Signed)
Patient asking for return appointment to discuss more about heathy eating for weight loss. Counseled on exercise but she states that she cannot find for this due to her busy schedule. Check lipid panel with direct LDL since patient ate breakfast today.

## 2017-07-21 NOTE — Patient Instructions (Signed)
It was good to see you today!  For your blood pressure - Keep taking your blood pressure medication, no changes today  We have placed a referral for you to get a screening colonoscopy. Please let us know if you have not heard back about scheduling an appointment after 2 weeks.  We are checking some labs today. If results require attention, either myself or my nurse will get in touch with you. If everything is normal, you will get a letter in the mail or a message in My Chart. Please give us a call if you do not hear from us after 2 weeks.  Please check-out at the front desk before leaving the clinic. Make an appointment whenever is good for you to talk about weight loss.   Please bring all of your medications with you to each visit.   Sign up for My Chart to have easy access to your labs results, and communication with your primary care physician.  Feel free to call with any questions or concerns at any time, at [phone].   Take care,  Dr. Leland HerElsia J Abreanna Drawdy, DO [facility]

## 2017-07-22 LAB — CBC
HEMOGLOBIN: 15 g/dL (ref 11.1–15.9)
Hematocrit: 44.1 % (ref 34.0–46.6)
MCH: 31.3 pg (ref 26.6–33.0)
MCHC: 34 g/dL (ref 31.5–35.7)
MCV: 92 fL (ref 79–97)
Platelets: 345 10*3/uL (ref 150–379)
RBC: 4.79 x10E6/uL (ref 3.77–5.28)
RDW: 13.8 % (ref 12.3–15.4)
WBC: 5.5 10*3/uL (ref 3.4–10.8)

## 2017-07-22 LAB — BASIC METABOLIC PANEL
BUN/Creatinine Ratio: 15 (ref 9–23)
BUN: 13 mg/dL (ref 6–24)
CO2: 29 mmol/L (ref 20–29)
CREATININE: 0.84 mg/dL (ref 0.57–1.00)
Calcium: 10 mg/dL (ref 8.7–10.2)
Chloride: 97 mmol/L (ref 96–106)
GFR calc Af Amer: 91 mL/min/{1.73_m2} (ref >59)
GFR calc non Af Amer: 79 mL/min/{1.73_m2} (ref >59)
GLUCOSE: 90 mg/dL (ref 65–99)
Potassium: 5.2 mmol/L (ref 3.5–5.2)
SODIUM: 140 mmol/L (ref 134–144)

## 2017-07-22 LAB — LIPID PANEL
Chol/HDL Ratio: 6.2 ratio — ABNORMAL HIGH (ref 0.0–4.4)
Cholesterol, Total: 236 mg/dL — ABNORMAL HIGH (ref 100–199)
HDL: 38 mg/dL — ABNORMAL LOW (ref >39)
LDL Calculated: 187 mg/dL — ABNORMAL HIGH (ref 0–99)
TRIGLYCERIDES: 54 mg/dL (ref 0–149)
VLDL CHOLESTEROL CAL: 11 mg/dL (ref 5–40)

## 2017-07-22 LAB — LDL CHOLESTEROL, DIRECT: LDL DIRECT: 195 mg/dL — AB (ref 0–99)

## 2017-07-22 MED ORDER — HYDROCHLOROTHIAZIDE 12.5 MG PO CAPS: 12.5000 mg | ORAL_CAPSULE | Freq: Every day | ORAL | 0 refills | Status: DC

## 2017-07-25 ENCOUNTER — Encounter: Payer: Self-pay | Admitting: Family Medicine

## 2017-07-25 ENCOUNTER — Telehealth: Payer: Self-pay

## 2017-07-25 NOTE — Telephone Encounter (Signed)
Patient left message on nurse line that she is returning a call to PCP. Ples SpecterAlisa Brake, RN [facility] RN)

## 2017-07-26 NOTE — Telephone Encounter (Signed)
Call patient twice with no answer.  Left message recommending that she come in for appointments as we can more fully discuss medication as well as diet and other lifestyle changes.  If patient is not agreeable to this that could potentially set up a time for a phone conversation as she has been difficult for me to contact.

## 2017-07-26 NOTE — Telephone Encounter (Signed)
Spoke with patient regarding labs and she is agreeable to starting on medication.  Pharmacy is correct in the chart and she would also like to be mailed a list of foods that she can eat or avoid to bring this number down.  Will forward to MD to advise. Choice Kleinsasser,CMA

## 2017-07-27 ENCOUNTER — Telehealth: Payer: Self-pay

## 2017-07-27 NOTE — Telephone Encounter (Signed)
Patient left message on nurse line that she is awaiting a medication to be called in for her cholesterol and also some diet information. Ples SpecterAlisa Chayla Shands, RN [facility] RN)

## 2017-07-28 ENCOUNTER — Telehealth: Payer: Self-pay | Admitting: Family Medicine

## 2017-07-28 DIAGNOSIS — E785 Hyperlipidemia, unspecified: Secondary | ICD-10-CM

## 2017-07-28 NOTE — Telephone Encounter (Signed)
Left message that patient needs to come in for appointment as I have tried to call her and coordinate a discussion several times without success.

## 2017-07-28 NOTE — Telephone Encounter (Signed)
Spoke with patient on phone. Recommended checking baseline LFTs at lab visit before starting statin and needing appointment for discussing statin initiation and dietary changes. Patient will call back for appointment.

## 2017-08-19 ENCOUNTER — Encounter: Payer: Self-pay | Admitting: Family Medicine

## 2017-10-14 ENCOUNTER — Telehealth: Payer: Self-pay

## 2017-10-14 NOTE — Telephone Encounter (Signed)
Pt calling, states she is wanting to get paperwork started for her to receive long term disability due to her arthritis. Pt call back [phone] Shawna OrleansMeredith B Thomsen, RN

## 2017-10-17 NOTE — Telephone Encounter (Signed)
Left message for patient that she can come in for appointment for medical assessment of arthritis to discuss any medication changes or rule out any red flags for dangerous pathology. Recommended that if her primary concern is paperwork that she may need to seek out assessment for this at a place that offers testing for physical capacity profile and occupational medicine.

## 2017-10-21 ENCOUNTER — Other Ambulatory Visit: Payer: Self-pay | Admitting: Family Medicine

## 2017-10-21 DIAGNOSIS — I10 Essential (primary) hypertension: Secondary | ICD-10-CM

## 2017-12-17 ENCOUNTER — Emergency Department (HOSPITAL_BASED_OUTPATIENT_CLINIC_OR_DEPARTMENT_OTHER)
Admission: EM | Admit: 2017-12-17 | Discharge: 2017-12-17 | Disposition: A | Discharge status: Home / Self Care | Attending: Emergency Medicine | Admitting: Emergency Medicine

## 2017-12-17 ENCOUNTER — Encounter (HOSPITAL_BASED_OUTPATIENT_CLINIC_OR_DEPARTMENT_OTHER): Payer: Self-pay | Admitting: Emergency Medicine

## 2017-12-17 ENCOUNTER — Emergency Department (HOSPITAL_BASED_OUTPATIENT_CLINIC_OR_DEPARTMENT_OTHER)

## 2017-12-17 ENCOUNTER — Other Ambulatory Visit: Payer: Self-pay

## 2017-12-17 DIAGNOSIS — Z7982 Long term (current) use of aspirin: Secondary | ICD-10-CM | POA: Insufficient documentation

## 2017-12-17 DIAGNOSIS — M5431 Sciatica, right side: Secondary | ICD-10-CM | POA: Diagnosis not present

## 2017-12-17 DIAGNOSIS — Z79899 Other long term (current) drug therapy: Secondary | ICD-10-CM | POA: Insufficient documentation

## 2017-12-17 DIAGNOSIS — M79604 Pain in right leg: Secondary | ICD-10-CM | POA: Insufficient documentation

## 2017-12-17 DIAGNOSIS — I10 Essential (primary) hypertension: Secondary | ICD-10-CM | POA: Diagnosis not present

## 2017-12-17 DIAGNOSIS — M25551 Pain in right hip: Secondary | ICD-10-CM | POA: Diagnosis present

## 2017-12-17 HISTORY — DX: Unspecified osteoarthritis, unspecified site: M19.90

## 2017-12-17 HISTORY — DX: Essential (primary) hypertension: I10

## 2017-12-17 MED ORDER — MELOXICAM 15 MG PO TABS: 15.0000 mg | ORAL_TABLET | Freq: Every day | ORAL | 0 refills | Status: DC

## 2017-12-17 MED ORDER — TRAMADOL HCL 50 MG PO TABS: 50.0000 mg | ORAL_TABLET | Freq: Four times a day (QID) | ORAL | 0 refills | Status: DC | PRN

## 2017-12-17 MED ORDER — METHYLPREDNISOLONE 4 MG PO TBPK: ORAL_TABLET | ORAL | 0 refills | Status: DC

## 2017-12-17 MED ORDER — HYDROCODONE-ACETAMINOPHEN 5-325 MG PO TABS
1.0000 | ORAL_TABLET | Freq: Once | ORAL | Status: AC
  Administered 2017-12-17: 1 via ORAL
  Filled 2017-12-17: qty 1

## 2017-12-17 MED ORDER — ONDANSETRON 4 MG PO TBDP
4.0000 mg | ORAL_TABLET | Freq: Once | ORAL | Status: AC
  Administered 2017-12-17: 4 mg via ORAL
  Filled 2017-12-17: qty 1

## 2017-12-17 NOTE — ED Notes (Signed)
Transported to xray but refuses xray until MD eval's her

## 2017-12-17 NOTE — ED Triage Notes (Signed)
Pt c/o R hip pain radiating down her leg x 2 days. Denies injury.

## 2017-12-17 NOTE — ED Provider Notes (Signed)
[facility] EMERGENCY DEPARTMENT Provider Note   CSN: [id number] Arrival date & time: 12/17/17  1725     History   Chief Complaint Chief Complaint  Patient presents with  . Hip Pain    HPI Jackie Con MemosO Holden is a 57 y.o. female who presents the emergency department chief complaint of right hip and leg pain.  Patient states she has a history of sciatica.  She says that the other day she was leaning over the to pick something up off of her cart when she stood up she felt something pull in her back.  Since then she is having pain rating of the back of her right leg and the front of her right leg.  She states is worse when she ambulates.  She is been taking Motrin without relief.  HPI  Past Medical History:  Diagnosis Date  . Arthritis   . Hypertension     Patient Active Problem List   Diagnosis Date Noted  . Chronic neck pain 08/11/2016  . Dyslipidemia (high LDL; low HDL) 12/15/2015  . Well woman exam 12/07/2013  . POTENTIAL CARIES 10/31/2009  . OBESITY, UNSPECIFIED 06/16/2009  . SMOKER 06/16/2009  . ESSENTIAL HYPERTENSION, BENIGN 06/16/2009    Past Surgical History:  Procedure Laterality Date  . ABDOMINAL HYSTERECTOMY       OB History    Gravida  1   Para      Term      Preterm      AB  1   Living        SAB      TAB  1   Ectopic      Multiple      Live Births               Home Medications    Prior to Admission medications   Medication Sig Start Date End Date Taking? Authorizing Provider  aspirin 81 MG EC tablet Take 81 mg by mouth daily. Swallow whole.   Yes [provider]  diclofenac (VOLTAREN) 75 MG EC tablet Take 1 tablet (75 mg total) by mouth 2 (two) times daily. 08/09/16  Yes Hudnall, Azucena FallenShane R, MD  hydrochlorothiazide (MICROZIDE) 12.5 MG capsule TAKE 1 CAPSULE BY MOUTH EVERY DAY 10/21/17  Yes Leland HerYoo, Elsia J, DO    Family History Family History  Problem Relation Age of Onset  . Diabetes Sister   . Hypertension  Sister   . Diabetes Brother   . Hypertension Brother   . Stroke Brother   . Diabetes Brother   . Hypertension Father   . Hypertension Mother     Social History Social History   Tobacco Use  . Smoking status: Never Smoker  . Smokeless tobacco: Never Used  Substance Use Topics  . Alcohol use: Yes    Comment: ocassionally  . Drug use: Not on file     Allergies   Patient has no known allergies.   Review of Systems Review of Systems Ten systems reviewed and are negative for acute change, except as noted in the HPI.    Physical Exam Updated Vital Signs BP 132/72 (BP Location: Left Arm)   Pulse 71   Temp 99.1 F (37.3 C) (Oral)   Resp 16   Ht 5' (1.524 m)   Wt 89.8 kg (198 lb)   SpO2 96%   BMI 38.67 kg/m   Physical Exam  Constitutional: She is oriented to person, place, and time. She appears well-developed and well-nourished. No  distress.  HENT:  Head: Normocephalic and atraumatic.  Eyes: Conjunctivae are normal. No scleral icterus.  Neck: Normal range of motion.  Cardiovascular: Normal rate, regular rhythm, normal heart sounds and intact distal pulses. Exam reveals no gallop and no friction rub.  No murmur heard. Pulmonary/Chest: Effort normal and breath sounds normal. No respiratory distress.  Abdominal: Soft. Bowel sounds are normal. She exhibits no distension and no mass. There is no tenderness. There is no guarding.  Musculoskeletal:  Patient with no midline tenderness.  Full range of motion of the hip.  Positive straight leg test on the left, normal reflexes, DP and PT pulses normal bilaterally, no peripheral edema.  Legs symmetric  Neurological: She is alert and oriented to person, place, and time.  Skin: Skin is warm and dry. She is not diaphoretic.  Psychiatric: Her behavior is normal.  Nursing note and vitals reviewed.    ED Treatments / Results  Labs (all labs ordered are listed, but only abnormal results are displayed) Labs Reviewed - No data to  display  EKG None  Radiology Dg Hip Unilat  With Pelvis 2-3 Views Right  Result Date: 12/17/2017 CLINICAL DATA:  Chronic right hip and sciatic pain. EXAM: DG HIP (WITH OR WITHOUT PELVIS) 2-3V RIGHT COMPARISON:  None. FINDINGS: Lower lumbar facet arthropathy at L4-5 and L5-S1 bilaterally. Pelvis and bilateral hips are intact without fracture or joint dislocation. No pelvic diastasis. Sacroiliac joints and pubic symphysis are unremarkable. Both hip joints are maintained without significant joint space narrowing. There appears be a tubal ligation/surgical clip on the right. IMPRESSION: Lower lumbar degenerative facet arthropathy. No acute osseous abnormality. Electronically Signed   By: Tollie Eth M.D.   On: 12/17/2017 20:29    Procedures Procedures (including critical care time)  Medications Ordered in ED Medications - No data to display   Initial Impression / Assessment and Plan / ED Course  I have reviewed the triage vital signs and the nursing notes.  Pertinent labs & imaging results that were available during my care of the patient were reviewed by me and considered in my medical decision making (see chart for details).     Patient with back pain.  No neurological deficits and normal neuro exam.  Patient can walk but states is painful.  No loss of bowel or bladder control.  No concern for cauda equina.  No fever, night sweats, weight loss, h/o cancer, IVDU.  RICE protocol and pain medicine indicated and discussed with patient.    Final Clinical Impressions(s) / ED Diagnoses   Final diagnoses:  Sciatica of right side    ED Discharge Orders    None       Arthor Captain, PA-C 12/18/17 0028    Tegeler, Canary Brim, MD 12/18/17 [phone]

## 2017-12-17 NOTE — Discharge Instructions (Addendum)
Contact a health care provider if:  You have pain that wakes you up when you are sleeping.  You have pain that gets worse when you lie down.  Your pain is worse than you have experienced in the past.  Your pain lasts longer than 4 weeks.  You experience unexplained weight loss.  Get help right away if:  You lose control of your bowel or bladder (incontinence).  You have:  Weakness in your lower back, pelvis, buttocks, or legs that gets worse.  Redness or swelling of your back.  A burning sensation when you urinate.

## 2018-01-18 ENCOUNTER — Other Ambulatory Visit: Payer: Self-pay | Admitting: Family Medicine

## 2018-01-18 DIAGNOSIS — I10 Essential (primary) hypertension: Secondary | ICD-10-CM

## 2018-01-26 ENCOUNTER — Other Ambulatory Visit: Payer: Self-pay | Admitting: *Deleted

## 2018-01-27 MED ORDER — DICLOFENAC SODIUM 75 MG PO TBEC: 75.0000 mg | DELAYED_RELEASE_TABLET | Freq: Two times a day (BID) | ORAL | 0 refills | Status: DC

## 2018-03-02 IMAGING — CR DG CERVICAL SPINE COMPLETE 4+V
6 series · 6 of 6 positions shown · non-contrast
Comparison: None.

CLINICAL DATA: Right side neck pain, right shoulder pain for about
2 weeks, no known injury

EXAM:
CERVICAL SPINE - COMPLETE 4+ VIEW

[w c-spine lat]
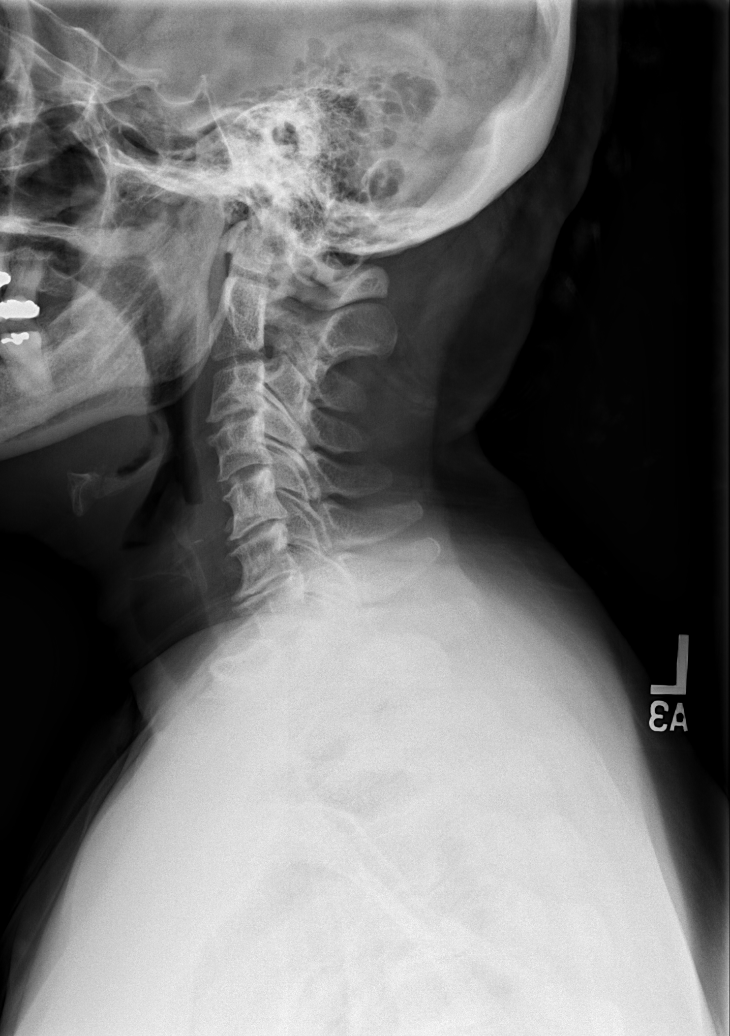

[w c-spine oblique (1 of 2)]
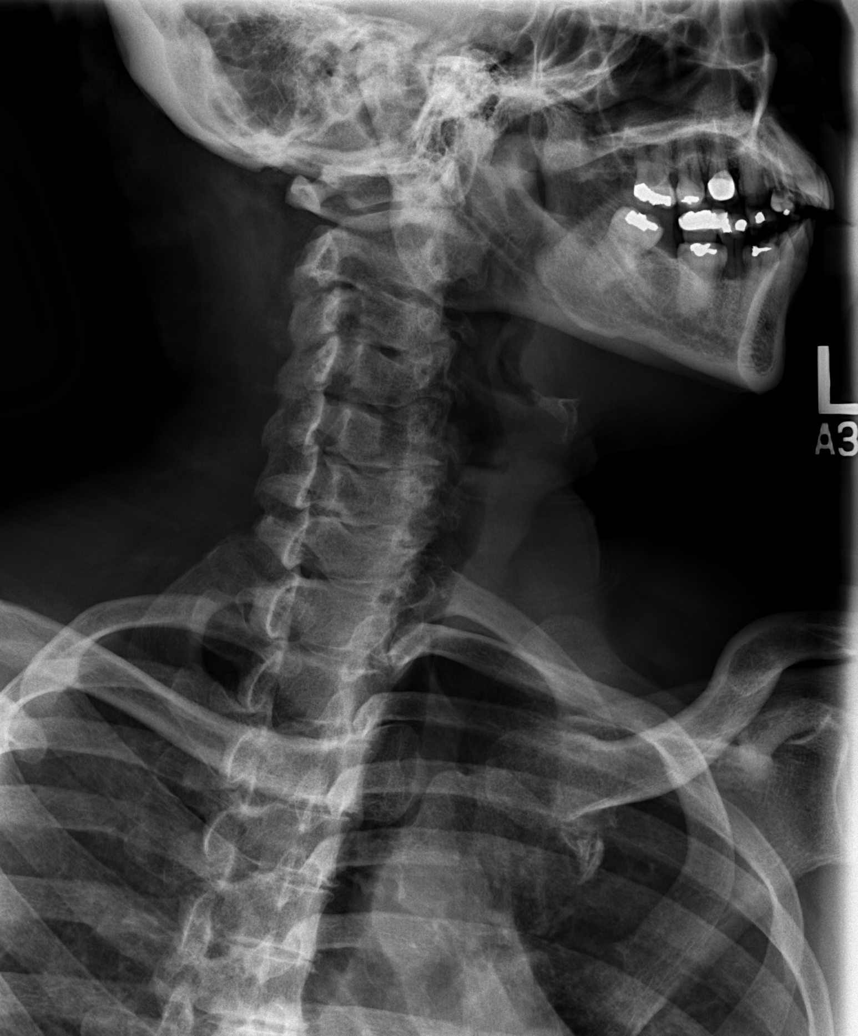

[w c-spine oblique (2 of 2)]
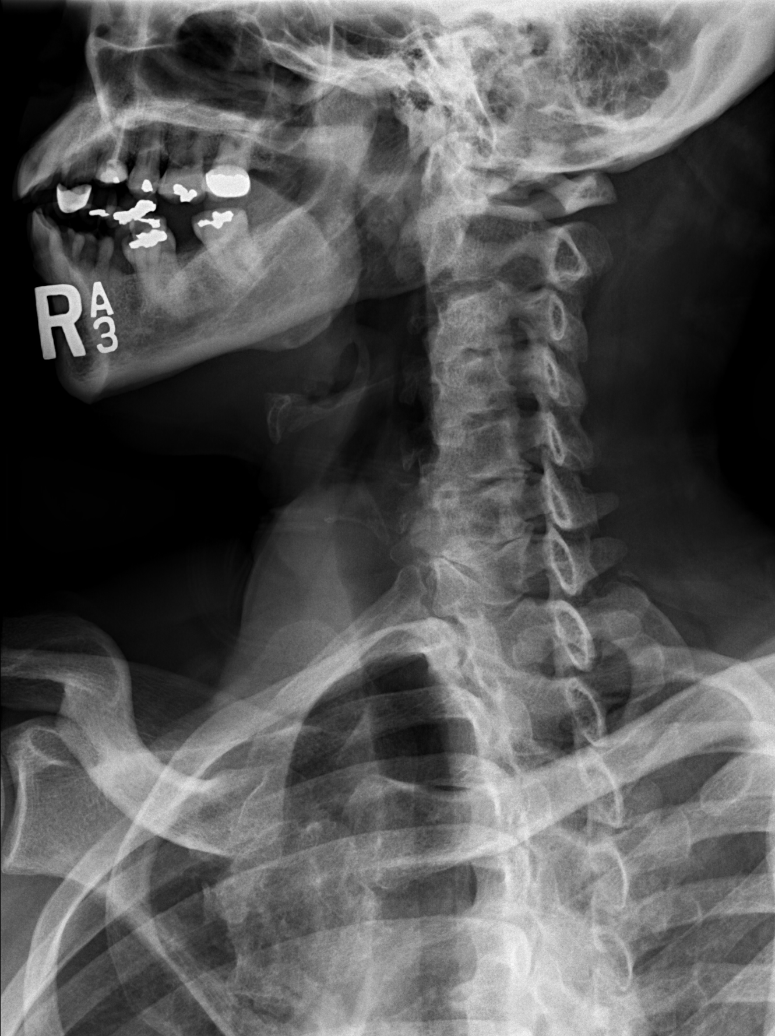

[w c-spine a.p.]
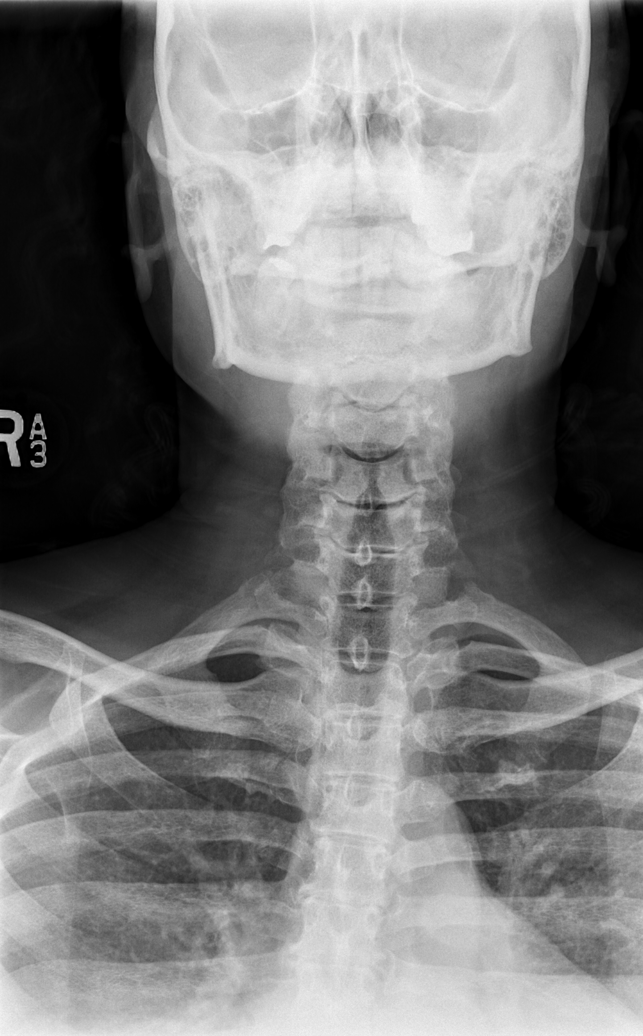

[w c-spine odontoid]
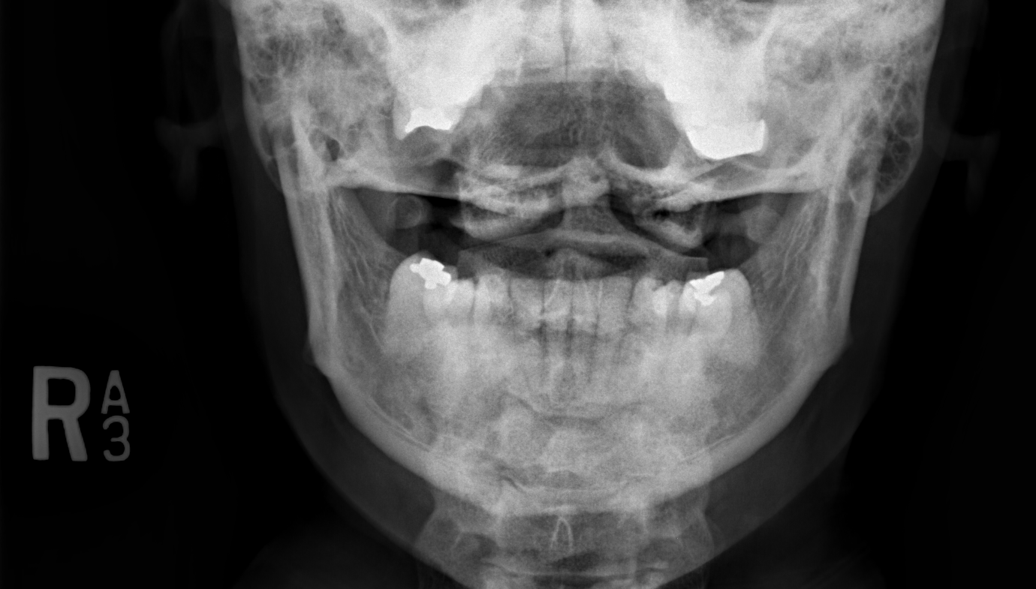

[w swimmers view]
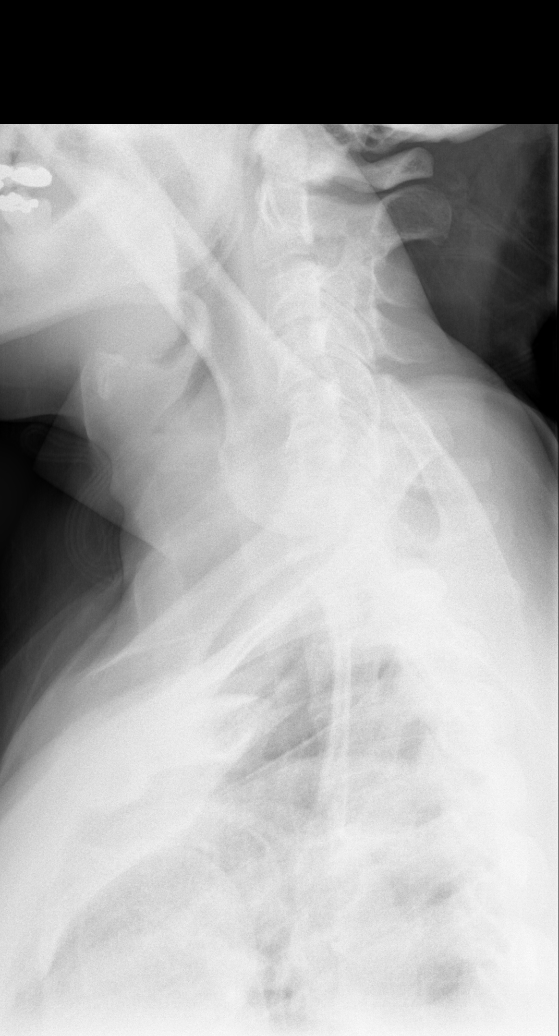

[6 of 6 positions shown; findings below may reference images not displayed]

FINDINGS: Six views of the cervical spine submitted. No acute fracture or
subluxation. There is mild disc space flattening with anterior
spurring at C3-C4 and C5-C6 level. Mild displaced flattening with
minimal anterior spurring at C4-C5 level. There is moderate disc
space flattening with mild to moderate anterior spurring at C6-C7
level. Mild narrowing of the right neural foramina at C6-C7 level.
C1-C2 relationship is unremarkable.
IMPRESSION: No acute fracture or subluxation. Multilevel mild degenerative
changes as described above.

## 2018-06-20 ENCOUNTER — Encounter (HOSPITAL_COMMUNITY): Payer: Self-pay | Admitting: *Deleted

## 2018-07-27 ENCOUNTER — Other Ambulatory Visit: Payer: Self-pay | Admitting: Family Medicine

## 2018-08-12 ENCOUNTER — Other Ambulatory Visit: Payer: Self-pay | Admitting: Family Medicine

## 2018-08-24 ENCOUNTER — Other Ambulatory Visit: Payer: Self-pay | Admitting: Family Medicine

## 2018-09-13 ENCOUNTER — Other Ambulatory Visit: Payer: Self-pay | Admitting: *Deleted

## 2018-09-13 DIAGNOSIS — I10 Essential (primary) hypertension: Secondary | ICD-10-CM

## 2018-09-13 MED ORDER — DICLOFENAC SODIUM 75 MG PO TBEC: 75.0000 mg | DELAYED_RELEASE_TABLET | Freq: Two times a day (BID) | ORAL | 0 refills | Status: DC

## 2018-09-13 MED ORDER — HYDROCHLOROTHIAZIDE 12.5 MG PO CAPS: ORAL_CAPSULE | ORAL | 3 refills | Status: DC

## 2018-10-11 ENCOUNTER — Other Ambulatory Visit: Payer: Self-pay | Admitting: Family Medicine

## 2018-11-10 ENCOUNTER — Other Ambulatory Visit: Payer: Self-pay | Admitting: Family Medicine

## 2018-11-10 NOTE — Telephone Encounter (Signed)
Needs BMP check before refill. Patient also due for HTN follow up. Please call to schedule

## 2018-11-14 NOTE — Telephone Encounter (Signed)
appt made and patient informed.  Kataleah Bejar,CMA

## 2018-11-21 ENCOUNTER — Encounter: Payer: Self-pay | Admitting: Family Medicine

## 2018-11-21 ENCOUNTER — Ambulatory Visit (INDEPENDENT_AMBULATORY_CARE_PROVIDER_SITE_OTHER): Payer: Self-pay | Admitting: Family Medicine

## 2018-11-21 ENCOUNTER — Other Ambulatory Visit: Payer: Self-pay

## 2018-11-21 VITALS — BP 128/82 | Wt 205.0 lb

## 2018-11-21 DIAGNOSIS — M5441 Lumbago with sciatica, right side: Secondary | ICD-10-CM

## 2018-11-21 DIAGNOSIS — I10 Essential (primary) hypertension: Secondary | ICD-10-CM

## 2018-11-21 DIAGNOSIS — E785 Hyperlipidemia, unspecified: Secondary | ICD-10-CM

## 2018-11-21 DIAGNOSIS — G8929 Other chronic pain: Secondary | ICD-10-CM

## 2018-11-21 DIAGNOSIS — Z1211 Encounter for screening for malignant neoplasm of colon: Secondary | ICD-10-CM

## 2018-11-21 DIAGNOSIS — B353 Tinea pedis: Secondary | ICD-10-CM

## 2018-11-21 MED ORDER — TERBINAFINE HCL 1 % EX CREA: 1.0000 "application " | TOPICAL_CREAM | Freq: Two times a day (BID) | CUTANEOUS | 1 refills | Status: DC

## 2018-11-21 NOTE — Assessment & Plan Note (Signed)
Stable and at goal.  Continue HCTZ 12.5 mg daily.  Check BMP.

## 2018-11-21 NOTE — Assessment & Plan Note (Signed)
Continue baby aspirin.  Overdue for lipid panel.  Last LDL elevated to 187 on 07/21/2017. Discussed that could possibly start cholesterol medication if indicated.  Patient voiced good understanding.

## 2018-11-21 NOTE — Progress Notes (Signed)
    Subjective:  Jackie Holden is a 58 y.o. female who presents to the [facility] today with HTN follow up   HPI:  Hypertension Takes hydrochlorothiazide 12.5 mg daily, compliant, tolerating well. Does not check her blood pressures at home. No lightheadedness, dizziness, chest pain, shortness of breath, lower extremity edema.  Hyperlipidemia Takes a baby aspirin.  Not on any cholesterol medication.  Would like her cholesterol rechecked today. She is not fasting today.  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 Follows with Dr. Ollen Bowl at [facility]  Dr. Dutch Quint for pain management   Foot concerns Has noticed recently that she has had peeling and itching around the bottoms of her feet and in between her toes.  She has had no redness, rashes, drainage.  She has been using Vaseline without much improvement.  ROS: Per HPI   CC, SH/smoking status, and VS noted  Objective:  Physical Exam: BP 128/82   Gen: NAD, resting comfortably CV: RRR with no murmurs appreciated Pulm: NWOB, CTAB with no crackles, wheezes, or rhonchi GI: Normal bowel sounds present. Soft, Nontender, Nondistended. MSK: no edema, cyanosis, or clubbing noted Skin: Skin desquamation in moccasin distribution of both feet.  No purulence, erythema, edema. Neuro: grossly normal, moves all extremities Psych: Normal affect and thought content   Assessment/Plan:  ESSENTIAL HYPERTENSION, BENIGN Stable and at goal.  Continue HCTZ 12.5 mg daily.  Check BMP.  Dyslipidemia (high LDL; low HDL) Continue baby aspirin.  Overdue for lipid panel.  Last LDL elevated to 187 on 07/21/2017. Discussed that could possibly start cholesterol medication if indicated.  Patient voiced good understanding.  Lower back pain Stable.  Followed by neurosurgery and pain management at [facility] neurosurgery [facility].  Tinea pedis of both feet Exam consistent with tinea pedis.  Treat with topical  terbinafine cream twice daily for 4 weeks.  Discussed that if no improvement could consider oral therapy.  Patient voiced understanding.    Orders Placed This Encounter  Procedures  . Basic metabolic panel  . Lipid panel  . Ambulatory referral to Gastroenterology    Referral Priority:   Routine    Referral Type:   Consultation    Referral Reason:   Specialty Services Required    Number of Visits Requested:   1    Meds ordered this encounter  Medications  . terbinafine (LAMISIL) 1 % cream    Sig: Apply 1 application topically 2 (two) times daily.    Dispense:  30 g    Refill:  1    Health Maintenance reviewed -advised patient to schedule screening colonoscopy, referral placed today.Leland Her, DO PGY-3, [facility] 11/21/2018 9:49 AM

## 2018-11-21 NOTE — Assessment & Plan Note (Signed)
Exam consistent with tinea pedis.  Treat with topical terbinafine cream twice daily for 4 weeks.  Discussed that if no improvement could consider oral therapy.  Patient voiced understanding.

## 2018-11-21 NOTE — Assessment & Plan Note (Addendum)
Stable.  Followed by neurosurgery and pain management at [facility] neurosurgery and spine.

## 2018-11-21 NOTE — Patient Instructions (Addendum)
It was good to see you today!  Keep taking your blood pressure medication.   We are checking some labs today. If results require attention, either myself or my nurse will get in touch with you. If everything is normal, you will get a letter in the mail or a message in My Chart. Please give [place] a call if you do not hear from [place] after 2 weeks.   Don't forget to get a colonoscopy.   Use terbinafine twice a day for 4 weeks.    Athlete's Foot  Athlete's foot (tinea pedis) is a fungal infection of the skin on your feet. It often occurs on the skin that is between or underneath your toes. It can also occur on the soles of your feet. Symptoms include itchy or white and flaky areas on the skin. The infection can spread from person to person (is contagious). It can also spread when a person's bare feet come in contact with the fungus on shower floors or on items such as shoes. Follow these instructions at home: Medicines  Apply or take over-the-counter and prescription medicines only as told by your doctor.  Apply your antifungal medicine as told by your doctor. Do not stop using the medicine even if your feet start to get better. Foot care  Do not scratch your feet.  Keep your feet dry: ? Wear cotton or wool socks. Change your socks every day or if they become wet. ? Wear shoes that allow air to move around, such as sandals or canvas tennis shoes.  Wash and dry your feet: ? Every day or as told by your doctor. ? After exercising. ? Including the area between your toes. General instructions  Do not share any of these items that touch your feet: ? Towels. ? Shoes. ? Nail clippers. ? Other personal items.  Protect your feet by wearing sandals in wet areas, such as locker rooms and shared showers.  Keep all follow-up visits as told by your doctor. This is important.  If you have diabetes, keep your blood sugar under control. Contact a doctor if:  You have a fever.  You have  swelling, pain, warmth, or redness in your foot.  Your feet are not getting better with treatment.  Your symptoms get worse.  You have new symptoms. Summary  Athlete's foot is a fungal infection of the skin on your feet.  Symptoms include itchy or white and flaky areas on the skin.  Apply your antifungal medicine as told by your doctor.  Keep your feet clean and dry. This information is not intended to replace advice given to you by your health care provider. Make sure you discuss any questions you have with your health care provider. Document Released: 11/24/2007 Document Revised: 03/28/2017 Document Reviewed: 03/28/2017 Elsevier Interactive Patient Education  2019 [employer].

## 2018-11-22 ENCOUNTER — Encounter: Payer: Self-pay | Admitting: Family Medicine

## 2018-11-22 ENCOUNTER — Other Ambulatory Visit: Payer: Self-pay | Admitting: Family Medicine

## 2018-11-22 DIAGNOSIS — E785 Hyperlipidemia, unspecified: Secondary | ICD-10-CM

## 2018-11-22 LAB — BASIC METABOLIC PANEL
BUN/Creatinine Ratio: 10 (ref 9–23)
BUN: 9 mg/dL (ref 6–24)
CO2: 27 mmol/L (ref 20–29)
Calcium: 9.1 mg/dL (ref 8.7–10.2)
Chloride: 97 mmol/L (ref 96–106)
Creatinine, Ser: 0.88 mg/dL (ref 0.57–1.00)
GFR calc Af Amer: 86 mL/min/{1.73_m2} (ref >59)
GFR calc non Af Amer: 74 mL/min/{1.73_m2} (ref >59)
Glucose: 95 mg/dL (ref 65–99)
Potassium: 4.1 mmol/L (ref 3.5–5.2)
Sodium: 140 mmol/L (ref 134–144)

## 2018-11-22 LAB — LIPID PANEL
Chol/HDL Ratio: 6.3 ratio — ABNORMAL HIGH (ref 0.0–4.4)
Cholesterol, Total: 201 mg/dL — ABNORMAL HIGH (ref 100–199)
HDL: 32 mg/dL — ABNORMAL LOW (ref >39)
LDL Calculated: 153 mg/dL — ABNORMAL HIGH (ref 0–99)
Triglycerides: 80 mg/dL (ref 0–149)
VLDL Cholesterol Cal: 16 mg/dL (ref 5–40)

## 2018-11-22 MED ORDER — ATORVASTATIN CALCIUM 40 MG PO TABS: 40.0000 mg | ORAL_TABLET | Freq: Every day | ORAL | 3 refills | Status: DC

## 2019-01-17 ENCOUNTER — Telehealth: Payer: Self-pay | Admitting: Gastroenterology

## 2019-01-17 NOTE — Telephone Encounter (Signed)
She called this morning. She has never been seen at [facility] that I can tell via Epic review. She thought she was supposed to have colonoscopy this morning at [facility] however she never received any information about it and never received a prep either.    I can tell in epic that she was referred to [facility] about 2 months ago however it does not look like that referral has ever been acted on.   I recommended she contact her PCP to discuss.

## 2019-04-11 ENCOUNTER — Other Ambulatory Visit: Payer: Self-pay | Admitting: Family Medicine

## 2019-04-11 ENCOUNTER — Telehealth: Payer: Self-pay | Admitting: Family Medicine

## 2019-04-11 DIAGNOSIS — Z1211 Encounter for screening for malignant neoplasm of colon: Secondary | ICD-10-CM

## 2019-04-11 NOTE — Telephone Encounter (Signed)
Pt informed. Pt will call and schedule colonoscopy. Ottis Stain, CMA

## 2019-04-11 NOTE — Telephone Encounter (Signed)
Pt is calling since she has new insurance and would like a referral to [facility] for a colonoscopy. jw

## 2019-04-11 NOTE — Telephone Encounter (Signed)
Referral placed  Thanks!

## 2019-08-24 ENCOUNTER — Ambulatory Visit: Payer: Self-pay | Attending: Internal Medicine

## 2019-08-24 DIAGNOSIS — Z23 Encounter for immunization: Secondary | ICD-10-CM | POA: Insufficient documentation

## 2019-08-24 NOTE — Progress Notes (Signed)
   Covid-19 Vaccination Clinic  Name:  Jackie Holden    MRN: [id number] DOB: 12-12-1960  08/24/2019  [name] was observed post Covid-19 immunization for 15 minutes without incident. She was provided with Vaccine Information Sheet and instruction to access the V-Safe system.   [name] was instructed to call 911 with any severe reactions post vaccine: Marland Kitchen Difficulty breathing  . Swelling of face and throat  . A fast heartbeat  . A bad rash all over body  . Dizziness and weakness   Immunizations Administered    Name Date Dose VIS Date Route   Pfizer COVID-19 Vaccine 08/24/2019  3:35 PM 0.3 mL 06/01/2019 Intramuscular   Manufacturer: [employer], [employer]   Lot: [id number]   NDC: [address]-2

## 2019-09-25 ENCOUNTER — Ambulatory Visit: Payer: Self-pay | Attending: Internal Medicine

## 2019-09-25 DIAGNOSIS — Z23 Encounter for immunization: Secondary | ICD-10-CM

## 2019-09-25 NOTE — Progress Notes (Signed)
   Covid-19 Vaccination Clinic  Name:  Jackie Holden    MRN: [id number] DOB: November 23, 1960  09/25/2019  [name] was observed post Covid-19 immunization for 15 minutes without incident. She was provided with Vaccine Information Sheet and instruction to access the V-Safe system.   [name] was instructed to call 911 with any severe reactions post vaccine: Marland Kitchen Difficulty breathing  . Swelling of face and throat  . A fast heartbeat  . A bad rash all over body  . Dizziness and weakness   Immunizations Administered    Name Date Dose VIS Date Route   Pfizer COVID-19 Vaccine 09/25/2019  2:38 PM 0.3 mL 06/01/2019 Intramuscular   Manufacturer: [employer], [employer]   Lot: [id number]   NDC: [address]-1

## 2020-07-21 ENCOUNTER — Ambulatory Visit: Payer: Self-pay | Admitting: Family Medicine

## 2020-10-29 ENCOUNTER — Ambulatory Visit: Payer: Self-pay | Admitting: Family Medicine

## 2020-12-03 ENCOUNTER — Ambulatory Visit: Payer: Self-pay | Admitting: Family Medicine

## 2020-12-16 ENCOUNTER — Encounter: Payer: Self-pay | Admitting: Family Medicine

## 2020-12-16 ENCOUNTER — Other Ambulatory Visit: Payer: Self-pay

## 2020-12-16 ENCOUNTER — Ambulatory Visit (INDEPENDENT_AMBULATORY_CARE_PROVIDER_SITE_OTHER): Payer: BLUE CROSS/BLUE SHIELD | Admitting: Family Medicine

## 2020-12-16 ENCOUNTER — Telehealth: Payer: Self-pay | Admitting: Family Medicine

## 2020-12-16 VITALS — BP 157/90 | HR 83 | Wt 174.8 lb

## 2020-12-16 DIAGNOSIS — R252 Cramp and spasm: Secondary | ICD-10-CM

## 2020-12-16 DIAGNOSIS — Z599 Problem related to housing and economic circumstances, unspecified: Secondary | ICD-10-CM | POA: Diagnosis not present

## 2020-12-16 DIAGNOSIS — G8921 Chronic pain due to trauma: Secondary | ICD-10-CM

## 2020-12-16 DIAGNOSIS — M5441 Lumbago with sciatica, right side: Secondary | ICD-10-CM

## 2020-12-16 DIAGNOSIS — F419 Anxiety disorder, unspecified: Secondary | ICD-10-CM

## 2020-12-16 DIAGNOSIS — Z9689 Presence of other specified functional implants: Secondary | ICD-10-CM | POA: Diagnosis not present

## 2020-12-16 DIAGNOSIS — Z5989 Other problems related to housing and economic circumstances: Secondary | ICD-10-CM

## 2020-12-16 DIAGNOSIS — Z1211 Encounter for screening for malignant neoplasm of colon: Secondary | ICD-10-CM

## 2020-12-16 DIAGNOSIS — M542 Cervicalgia: Secondary | ICD-10-CM | POA: Diagnosis not present

## 2020-12-16 DIAGNOSIS — Z748 Other problems related to care provider dependency: Secondary | ICD-10-CM

## 2020-12-16 DIAGNOSIS — F339 Major depressive disorder, recurrent, unspecified: Secondary | ICD-10-CM

## 2020-12-16 DIAGNOSIS — G8929 Other chronic pain: Secondary | ICD-10-CM

## 2020-12-16 DIAGNOSIS — F32A Depression, unspecified: Secondary | ICD-10-CM

## 2020-12-16 DIAGNOSIS — Z5971 Insufficient health insurance coverage: Secondary | ICD-10-CM

## 2020-12-16 NOTE — Assessment & Plan Note (Addendum)
Jackie Holden last month on insurance was May 2022. States she no longer has the money to afford the [employer], as she had not been working since her work-related injury in 2019.  She has applied for disability, however that is still in the works. - Referral to [facility] - Encourage patient to apply for Medicaid - Once legal disability status granted, may be able to apply for Medicare - Treatments, specialists, medicines right now are limited by self-pay prices

## 2020-12-16 NOTE — Progress Notes (Addendum)
SUBJECTIVE:   CHIEF COMPLAINT / HPI:   Establish care -Last saw Dr. Frances Furbish here October 2020 - Gets majority of care through [facility] including pain management and internal medicine - Previously had [employer], however because she is not working since injury in 2019, she is no longer able to afford monthly co-pays for insurance and thus has lost insurance coverage - Would like to reestablish here as PCP, lives nearby - Patient notes increasingly worsening pain lower back pain, intermittent spasticity of unilateral lower extremities at times - Worsening headaches, feelings of "after shocks" after spinal stimulator implant - Quite anxious, tearful throughout visit reports worsening anxiety since injury in 2019  PERTINENT  PMH / PSH: HTN, HLD, lower back pain s/p spinal stimulator implanted 2021, 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  OBJECTIVE:   BP (!) 157/90   Pulse 83   Wt 174 lb 12.8 oz (79.3 kg)   SpO2 98%   BMI 34.14 kg/m   PHQ-9:  Depression screen [facility] 2/9 12/16/2020 11/21/2018 07/21/2017  Decreased Interest 3 0 0  Down, Depressed, Hopeless 2 0 0  PHQ - 2 Score 5 0 0  Altered sleeping 1 - -  Tired, decreased energy 1 - -  Change in appetite 2 - -  Feeling bad or failure about yourself  1 - -  Trouble concentrating 1 - -  Moving slowly or fidgety/restless 1 - -  Suicidal thoughts 0 - -  PHQ-9 Score 12 - -  Difficult doing work/chores Extremely dIfficult - -     GAD-7: No flowsheet data found.   Physical Exam General: Awake, alert, oriented, no acute distress Respiratory: Unlabored respirations, speaking in full sentences, no respiratory distress Extremities: Moving all extremities spontaneously Neuro: Cranial nerves II through X grossly intact Psych: Normal insight and judgement   ASSESSMENT/PLAN:   Insurance coverage problems Ms. Worm last month on insurance was May 2022. States she no longer has the money to  afford the [employer], as she had not been working since her work-related injury in 2019.  She has applied for disability, however that is still in the works. - Referral to CCM - Encourage patient to apply for Medicaid - Once legal disability status granted, may be able to apply for Medicare - Treatments, specialists, medicines right now are limited by self-pay prices  Assistance needed with transportation Difficulty with driving due to lower back pain.  Signed up with [place] transportation services today.  Patient provided number to call to set up this transportation service to and from her appointments.  Lower back pain Despite lack of insurance currently, still sent referrals to neurology and pain management as these offices generally take many months to obtain appointment.  Hopeful that with CCM referral and help applying for Medicaid, she may have insurance by the time she will be able to get into see both neurology and pain management.  Patient requests no refills at this time.  Anxiety and depression Despite lack of insurance currently, still sent referral to psychiatry as it generally takes many months to obtain appointment.  Hopeful that with CCM referral and help applying for Medicaid, she may have insurance by the time she will be able to get into see psychiatry.  Also discussed benefit of therapy in addition to psychiatry using techniques such as CBT.  Encourage patient to use psychology today.com website to start search for therapist.  Encouraged her to call around and get established  with therapist.  Screen for colon cancer Despite lack of insurance currently, still sent referral to gastroenterology for colonoscopy as it generally takes many months to obtain appointment.  Hopeful that with CCM referral and help applying for Medicaid, she may have insurance by the time she will be able to get into GI for colonoscopy.     Fayette Pho, MD [facility]

## 2020-12-16 NOTE — Patient Instructions (Addendum)
It was wonderful to meet you today. Thank you for allowing me to be a part of your care. Below is a short summary of what we discussed at your visit today:  Establishing as a patient at the [facility] Today we reviewed all of your health history, including past medical conditions, medications, past surgeries, and allergies.  We also reviewed your vaccine status and necessary screenings, those are discussed below.  Care Coordination, social work support -Today have referred you to care coordination - They may be able to help you with Medicaid and possibly Medicare applications - They will also be able to give you information on transportation services in the community  [place] transportation system Today you signed a release form so we may use your information to get you set up with the [facility] transportation service. Call [phone] to set up complementary rides to and from your health appointments within the [facility] system.   Therapy Finding a therapist also takes some time.  Please use website such as psychology today.com to start research.  Many therapists may be able to work with your financial situation.  Referrals Today have referred you to pain management, neurology, psychiatry, and the GI doctor (for colonoscopy).   I know you do not have insurance at this time.  However, these specialists woke up quickly and take several months to get in.  I would like to get you on the books for several months out while you are also working on your Medicaid application.  When these offices call you to make appointments, please let them know this.  Colonoscopy I have referred you to a GI office today for colonoscopy. This GI office will be reaching out to you within a week or two to schedule the necessary appointments.   Please bring all of your medications to every appointment!  If you have any questions or concerns, please do not hesitate to contact us via  phone or MyChart message.   Fayette Pho, MD

## 2020-12-16 NOTE — Assessment & Plan Note (Signed)
Difficulty with driving due to lower back pain.  Signed up with [place] transportation services today.  Patient provided number to call to set up this transportation service to and from her appointments.

## 2020-12-16 NOTE — Telephone Encounter (Signed)
Called to speak with patient regarding appointment today. She was unsure what a primary care doctor implied and said that she elects to use [place] as her primary care clinic from now on. She will not be returning to internal medicine at [facility] per her.   As she has not been here since October 2020, I will treat this appointment today as a new patient appointment.   Fayette Pho, MD

## 2020-12-17 DIAGNOSIS — F419 Anxiety disorder, unspecified: Secondary | ICD-10-CM | POA: Insufficient documentation

## 2020-12-17 DIAGNOSIS — Z1211 Encounter for screening for malignant neoplasm of colon: Secondary | ICD-10-CM | POA: Insufficient documentation

## 2020-12-17 NOTE — Assessment & Plan Note (Signed)
Despite lack of insurance currently, still sent referrals to neurology and pain management as these offices generally take many months to obtain appointment.  Hopeful that with CCM referral and help applying for Medicaid, she may have insurance by the time she will be able to get into see both neurology and pain management.  Patient requests no refills at this time.

## 2020-12-17 NOTE — Assessment & Plan Note (Signed)
Despite lack of insurance currently, still sent referral to psychiatry as it generally takes many months to obtain appointment.  Hopeful that with CCM referral and help applying for Medicaid, she may have insurance by the time she will be able to get into see psychiatry.  Also discussed benefit of therapy in addition to psychiatry using techniques such as CBT.  Encourage patient to use psychology today.com website to start search for therapist.  Encouraged her to call around and get established with therapist.

## 2020-12-17 NOTE — Assessment & Plan Note (Signed)
Despite lack of insurance currently, still sent referral to gastroenterology for colonoscopy as it generally takes many months to obtain appointment.  Hopeful that with CCM referral and help applying for Medicaid, she may have insurance by the time she will be able to get into GI for colonoscopy.

## 2020-12-18 ENCOUNTER — Telehealth: Payer: Self-pay

## 2020-12-18 NOTE — Telephone Encounter (Signed)
   Telephone encounter was:  Unsuccessful.  12/18/2020 Name: Jackie Holden MRN: [id number] DOB: 11-13-1960  Unsuccessful outbound call made today to assist with:   Medicaid and transportation  Outreach Attempt:  1st Attempt  A HIPAA compliant voice message was left requesting a return call.  Instructed patient to call back at [phone].  [name], AAS Paralegal, [facility] Care Guide  Embedded Care Coordination [place]  Care Management  [address] [place], [place] [address] ??[email or web address]  ?? [phone]   [email or web address]

## 2020-12-30 ENCOUNTER — Telehealth: Payer: Self-pay

## 2020-12-30 NOTE — Telephone Encounter (Signed)
   Telephone encounter was:  Successful.  12/30/2020 Name: Jackie Holden MRN: [id number] DOB: 1960/09/03  Jackie Holden is a 60 y.o. year old female who is a primary care patient of Fayette Pho, MD . The community resource team was consulted for assistance with  Medicaid and TAMS applications.  Care guide performed the following interventions: Spoke with patient explained that due to the 4th holiday her information was not mailed out until July 6th. Patient stated that she would call me back if she has not received applications by Friday, July 15th.   Follow Up Plan:  No further follow up planned at this time. The patient has been provided with needed resources.  Jackie Holden, AAS Paralegal, [facility] Care Guide  Embedded Care Coordination [place]  Care Management  [address] [place], [place] [address] ??[email or web address]  ?? [phone]   [email or web address]

## 2021-01-08 ENCOUNTER — Encounter: Payer: Self-pay | Admitting: Family Medicine

## 2021-01-08 DIAGNOSIS — K635 Polyp of colon: Secondary | ICD-10-CM | POA: Insufficient documentation

## 2021-01-08 DIAGNOSIS — K5730 Diverticulosis of large intestine without perforation or abscess without bleeding: Secondary | ICD-10-CM | POA: Insufficient documentation

## 2021-02-13 ENCOUNTER — Encounter: Payer: Self-pay | Admitting: Family Medicine

## 2021-02-13 ENCOUNTER — Ambulatory Visit (INDEPENDENT_AMBULATORY_CARE_PROVIDER_SITE_OTHER): Payer: Self-pay | Admitting: Family Medicine

## 2021-02-13 ENCOUNTER — Other Ambulatory Visit (HOSPITAL_COMMUNITY)
Admission: RE | Admit: 2021-02-13 | Discharge: 2021-02-13 | Disposition: A | Discharge status: Home / Self Care | Payer: Medicaid Other | Source: Ambulatory Visit | Attending: Family Medicine | Admitting: Family Medicine

## 2021-02-13 ENCOUNTER — Other Ambulatory Visit: Payer: Self-pay

## 2021-02-13 VITALS — BP 139/82 | HR 91 | Wt 175.0 lb

## 2021-02-13 DIAGNOSIS — Z124 Encounter for screening for malignant neoplasm of cervix: Secondary | ICD-10-CM

## 2021-02-13 DIAGNOSIS — Z114 Encounter for screening for human immunodeficiency virus [HIV]: Secondary | ICD-10-CM | POA: Insufficient documentation

## 2021-02-13 DIAGNOSIS — Z01419 Encounter for gynecological examination (general) (routine) without abnormal findings: Secondary | ICD-10-CM

## 2021-02-13 DIAGNOSIS — I10 Essential (primary) hypertension: Secondary | ICD-10-CM

## 2021-02-13 DIAGNOSIS — Z113 Encounter for screening for infections with a predominantly sexual mode of transmission: Secondary | ICD-10-CM | POA: Insufficient documentation

## 2021-02-13 DIAGNOSIS — E785 Hyperlipidemia, unspecified: Secondary | ICD-10-CM

## 2021-02-13 DIAGNOSIS — Z1159 Encounter for screening for other viral diseases: Secondary | ICD-10-CM

## 2021-02-13 DIAGNOSIS — Z1231 Encounter for screening mammogram for malignant neoplasm of breast: Secondary | ICD-10-CM

## 2021-02-13 MED ORDER — ATORVASTATIN CALCIUM 40 MG PO TABS: 40.0000 mg | ORAL_TABLET | Freq: Every day | ORAL | 3 refills | Status: DC

## 2021-02-13 NOTE — Progress Notes (Signed)
    SUBJECTIVE:   CHIEF COMPLAINT / HPI:   Physical  - patient now has insurance coverage and would like full physical and preventative maintenance review - sees Dr. Allena Katz for pain management - last PAP in system 12/07/13, patient reports she may have received PAP from previous PCP at [facility] but is unsure; will request records today - colonoscopy last done 07/06/19 - [facility] [employer] [facility] [address], will request records as only vaccine in [place] database 05/04/2019 - next due for lipid panel and BMP in October/November - needs mammogram order, last in 2018 - UTD on COVID vaccines  PAP smear - last PAP in system 12/07/13, patient reports she may have received PAP from previous PCP at [facility] but is unsure; will request records today - no vaginal complaints such as discomfort, pain, discharge, rashes, lesions, or odor - discussed STI screening, patient amenable   PERTINENT  PMH / PSH: HTN, diverticulosis, tinea pedis, HLD, obesity, chronic neck and back pain, anxiety, depressed mood  OBJECTIVE:   BP 139/82   Pulse 91   Wt 175 lb (79.4 kg)   SpO2 100%   BMI 34.18 kg/m   Physical Exam General: Awake, alert, oriented, no acute distress Respiratory: CTAB Cardiac: RRR no murmurs Neuro: Cranial nerves II through X grossly intact, able to move all extremities spontaneously Vulva: Normal appearing vulva without rashes, lesions, or deformities Vagina: Pale pink rugated vaginal tissue without obvious lesions, physiologic discharge of whitish color, cervix without lesion or overt tenderness with swab   ASSESSMENT/PLAN:   Well woman exam PAP performed today. Patient s/p abdominal hysterectomy 20-30 years ago. No cervix to sample, but took scraping and swab of end of blind pouch. Tissue appears normal and without lesion. Last PAP in our system 2015. She may have undergone one with old PCP at [facility] but is unsure; will request records.    Routine screening for STI (sexually transmitted infection) No concerns or specific exposures. Simply requests routine STI testing. Obtained vaginal swab for GC, chlamydia, trichomonas and blood sample for HIV and RPR.   Screening for breast cancer Mammogram ordered today for [facility]. Patient given instructions for scheduling.   Dyslipidemia (high LDL; low HDL) Has run out of atorvastatin with gap in insurance coverage. Will refill today. Next due for lipid panel in Oct/Nov.   ESSENTIAL HYPERTENSION, BENIGN BP elevated today at 139/82. Next due for BMP to check potassium in Oct/Nov with lipid panel. Continue HCTZ 12.5 mg daily.      Fayette Pho, MD [facility]

## 2021-02-13 NOTE — Patient Instructions (Addendum)
It was wonderful to see you today. Thank you for allowing me to be a part of your care. Below is a short summary of what we discussed at your visit today:  Pap smear -If the results are normal, I will send you a letter or MyChart message. If the results are abnormal, I will give you a call.   -We also obtained a vaginal swab to screen for gonorrhea, chlamydia, and trichomonas - You opted to have a blood draw to test for HIV and syphilis  Mammogram -I have placed this order -Please call the breast imaging center directly to schedule your appointment  Cholesterol check -We will reach out to you sometime in October to schedule your next cholesterol check in November  Please bring all of your medications to every appointment!  If you have any questions or concerns, please do not hesitate to contact us via phone or MyChart message.   Fayette Pho, MD

## 2021-02-14 ENCOUNTER — Encounter: Payer: Self-pay | Admitting: Family Medicine

## 2021-02-14 DIAGNOSIS — Z1239 Encounter for other screening for malignant neoplasm of breast: Secondary | ICD-10-CM | POA: Insufficient documentation

## 2021-02-14 DIAGNOSIS — Z113 Encounter for screening for infections with a predominantly sexual mode of transmission: Secondary | ICD-10-CM | POA: Insufficient documentation

## 2021-02-14 LAB — RPR: RPR Ser Ql: NONREACTIVE

## 2021-02-14 LAB — HIV ANTIBODY (ROUTINE TESTING W REFLEX): HIV Screen 4th Generation wRfx: NONREACTIVE

## 2021-02-14 NOTE — Assessment & Plan Note (Signed)
Mammogram ordered today for [facility]. Patient given instructions for scheduling.

## 2021-02-14 NOTE — Assessment & Plan Note (Signed)
Has run out of atorvastatin with gap in insurance coverage. Will refill today. Next due for lipid panel in Oct/Nov.

## 2021-02-14 NOTE — Assessment & Plan Note (Signed)
PAP performed today. Patient s/p abdominal hysterectomy 20-30 years ago. No cervix to sample, but took scraping and swab of end of blind pouch. Tissue appears normal and without lesion. Last PAP in our system 2015. She may have undergone one with old PCP at [facility] but is unsure; will request records.

## 2021-02-14 NOTE — Assessment & Plan Note (Addendum)
BP elevated today at 139/82. Next due for BMP to check potassium in Oct/Nov with lipid panel. Continue HCTZ 12.5 mg daily.

## 2021-02-14 NOTE — Assessment & Plan Note (Signed)
No concerns or specific exposures. Simply requests routine STI testing. Obtained vaginal swab for GC, chlamydia, trichomonas and blood sample for HIV and RPR.

## 2021-02-16 ENCOUNTER — Other Ambulatory Visit: Payer: Self-pay

## 2021-02-16 DIAGNOSIS — E785 Hyperlipidemia, unspecified: Secondary | ICD-10-CM

## 2021-02-16 LAB — CYTOLOGY - PAP
Chlamydia: NEGATIVE
Comment: NEGATIVE
Comment: NEGATIVE
Comment: NORMAL
Diagnosis: NEGATIVE
Neisseria Gonorrhea: NEGATIVE
Trichomonas: NEGATIVE

## 2021-02-16 MED ORDER — ATORVASTATIN CALCIUM 40 MG PO TABS
40.0000 mg | ORAL_TABLET | Freq: Every day | ORAL | 3 refills | Status: AC
  Filled 2021-03-30: qty 30, 30d supply, fill #0

## 2021-02-16 NOTE — Telephone Encounter (Signed)
Patient calls nurse line regarding issues with affording atorvastatin.  Patient does not currently have insurance. Advised using goodrx coupon. With coupon, patient can receive 30 day supply for $15. Patient requests medication be transferred to [facility] on [address].   [facility] Resent to preferred pharmacy.  Veronda Prude, RN

## 2021-03-05 ENCOUNTER — Telehealth: Payer: Self-pay

## 2021-03-05 NOTE — Telephone Encounter (Signed)
CCM referral placed °

## 2021-03-05 NOTE — Telephone Encounter (Signed)
Please place a CCM referral for assistance with psych referral. Thanks [facility]

## 2021-03-05 NOTE — Telephone Encounter (Signed)
Patient calls nurse line regarding issues with referral to psychiatry. Sent message to Christiane Ha, who has not been able to make contact with patient. Informed that they were unable to accept referral.   Please advise.   Veronda Prude, RN

## 2021-03-10 ENCOUNTER — Ambulatory Visit: Payer: Self-pay | Admitting: Licensed Clinical Social Worker

## 2021-03-10 ENCOUNTER — Telehealth: Payer: Self-pay | Admitting: *Deleted

## 2021-03-10 DIAGNOSIS — F419 Anxiety disorder, unspecified: Secondary | ICD-10-CM

## 2021-03-10 DIAGNOSIS — F32A Depression, unspecified: Secondary | ICD-10-CM

## 2021-03-10 NOTE — Chronic Care Management (AMB) (Signed)
  Care Management  Collaboration  Note  03/10/2021 Name: Jackie Holden MRN: [id number] DOB: 09-18-1960  Jackie Holden is a 60 y.o. year old female who is a primary care patient of Fayette Pho, MD. The CCM team was consulted reference care coordination needs for Mental Health Counseling and Resources.  Assessment: Patient was not interviewed or contacted during this encounter.   Intervention:Conducted brief assessment, recommendations and relevant information discussed.  CCM LCSW collaborated with CCM Care Guide.  LCSW placed referral to correct place [facility]) due to patient not having insurance .    Follow up Plan:  Care Guild scheduled appointment with LCSW in Oct.   Review of patient past medical history, allergies, medications, and health status, including review of pertinent consultant reports was performed as part of comprehensive evaluation and provision of care management/care coordination services.    Sammuel Hines, LCSW Care Management & Coordination  [facility]   [phone] 3:54 PM

## 2021-03-10 NOTE — Patient Instructions (Signed)
   Patient was not contacted during this encounter.  LCSW collaborated with care team to accomplish patient's care plan goal  Patient was not contacted during this encounter.  LCSW provided consultation to assist with unmet needs.  Sammuel Hines, LCSW Care Management & Coordination  [phone]

## 2021-03-10 NOTE — Chronic Care Management (AMB) (Signed)
  Care Management   Note  03/10/2021 Name: Jackie Holden MRN: [id number] DOB: 01-16-1961  Jackie Holden is a 60 y.o. year old female who is a primary care patient of Fayette Pho, MD. I reached out to [employer] by phone today in response to a referral sent by Jackie Holden's PCP, Fayette Pho, MD.    [name] was given information about care management services today including:  Care management services include personalized support from designated clinical staff supervised by her physician, including individualized plan of care and coordination with other care providers 24/7 contact phone numbers for assistance for urgent and routine care needs. The patient may stop care management services at any time by phone call to the office staff.  Patient agreed to services and verbal consent obtained.   Follow up plan: Telephone appointment with care management team member scheduled for:03/25/2021  [facility] Guide, Embedded Care Coordination [facility]  Care Management  Direct Dial: [phone]

## 2021-03-18 ENCOUNTER — Telehealth: Payer: Self-pay

## 2021-03-18 NOTE — Telephone Encounter (Signed)
   Telephone encounter was:  Successful.  03/18/2021 Name: Jackie Holden MRN: [id number] DOB: [birth year]  Jackie Holden is a 60 y.o. year old female who is a primary care patient of Fayette Pho, MD . The community resource team was consulted for assistance with  [employer].  Care guide performed the following interventions: Spoke with patient she has dropped of the requested bank statements and will call Ewell Poe at [facility] Card to let her know to expect them.  Patient stated that she is waiting on a decision about her disability.    Follow Up Plan:  No further follow up planned at this time. The patient has been provided with needed resources.  Klynn Linnemann, AAS Paralegal, [facility] Care Guide  Embedded Care Coordination [place]  Care Management  [address] [place], [place] [address] [email or web address]  ?? [phone]   [email or web address]

## 2021-03-23 ENCOUNTER — Other Ambulatory Visit: Payer: Self-pay

## 2021-03-23 ENCOUNTER — Encounter: Payer: Self-pay | Admitting: Neurology

## 2021-03-23 ENCOUNTER — Ambulatory Visit: Payer: Self-pay | Admitting: Neurology

## 2021-03-23 VITALS — BP 123/76 | HR 75 | Ht 60.0 in | Wt 181.0 lb

## 2021-03-23 DIAGNOSIS — M542 Cervicalgia: Secondary | ICD-10-CM

## 2021-03-23 DIAGNOSIS — M545 Low back pain, unspecified: Secondary | ICD-10-CM

## 2021-03-23 NOTE — Progress Notes (Signed)
Chief Complaint  Patient presents with   New Patient (Initial Visit)    Rm 18, alone , here to discuss neck pain and lower back pain-today pt states pain scale 9       ASSESSMENT AND PLAN  Jackie Holden is a 60 y.o. female   Chronic low back pain,  Essentially normal neurological examinations,  MRI of cervical, thoracic spine showed no significant canal or foraminal stenosis,  Also had MRI of lumbar spine from outside hospital I do not have access is under the pain management,  I have suggested her continue moderate exercise, back stretching, pain management, NSAIDs as needed, warm compression  No further neurological evaluation is needed at this point  DIAGNOSTIC DATA (LABS, IMAGING, TESTING) - I reviewed patient records, labs, notes, testing and imaging myself where available.  MRI cervical in Nov 2020. Left foraminal disc protrusion and osteophyte C7-T1 causing left C8  nerve root impingement.   Disc and facet degeneration throughout the cervical spine causing  mild spinal and foraminal stenosis as described above.   MRI thoracic in April 2021. Mild multilevel degenerative disc disease without central canal or  foraminal narrowing.   MEDICAL HISTORY:  Jackie Holden is a 60 year old female, seen in request by her primary care physician Dr. Fayette Pho for evaluation of low back pain, neck pain, initial evaluation was March 23, 2021  I reviewed and summarized the referring note. PMHX. HTN HLD.  Patient stated that she suffered a fall in March 2019, she was working at [employer] at that time, she bending down for a tray of bagel on the table, then she felt instant pop of her lower back, had significant low back pain since then, gradually getting worse over the past 3 years, radiating pain to right lower extremity, recently also extending to her neck, to the point for her difficulty walking,  She was seen by pain management already, had imagings study of  her spine, received epidural injection with limited help  She is in the process of applying for disability, she denies persistent sensory loss, denies bowel bladder incontinence. Reviewed MRI cervical November 2020 from outside hospital, mild degenerative changes, no significant foraminal canal stenosis  MRI of the thoracic spine, mild degenerative changes, no evidence of canal stenosis  Also had MRI cervical spine from outside clinic, I do not have access to report  PHYSICAL EXAM:   Vitals:   03/23/21 1041  BP: 123/76  Pulse: 75  Weight: 181 lb (82.1 kg)  Height: 5' (1.524 m)   Not recorded     Body mass index is 35.35 kg/m.  PHYSICAL EXAMNIATION:  Gen: NAD, conversant, well nourised, well groomed                     Cardiovascular: Regular rate rhythm, no peripheral edema, warm, nontender. Eyes: Conjunctivae clear without exudates or hemorrhage Neck: Supple, no carotid bruits. Pulmonary: Clear to auscultation bilaterally   NEUROLOGICAL EXAM:  MENTAL STATUS: Depressed looking middle-aged female Speech:    Speech is normal; fluent and spontaneous with normal comprehension.  Cognition:     Orientation to time, place and person     Normal recent and remote memory     Normal Attention span and concentration     Normal Language, naming, repeating,spontaneous speech     Fund of knowledge   CRANIAL NERVES: CN II: Visual fields are full to confrontation. Pupils are round equal and briskly reactive to light. CN III,  IV, VI: extraocular movement are normal. No ptosis. CN V: Facial sensation is intact to light touch CN VII: Face is symmetric with normal eye closure  CN VIII: Hearing is normal to causal conversation. CN IX, X: Phonation is normal. CN XI: Head turning and shoulder shrug are intact  MOTOR: There is no pronator drift of out-stretched arms. Muscle bulk and tone are normal. Muscle strength is normal.  REFLEXES: Reflexes are 2+ and symmetric at the biceps,  triceps, knees, and ankles. Plantar responses are flexor.  SENSORY: Intact to light touch, pinprick and vibratory sensation are intact in fingers and toes.  COORDINATION: There is no trunk or limb dysmetria noted.  GAIT/STANCE: Need push-up to get up from seated position, steady, mildly antalgic  REVIEW OF SYSTEMS:  Full 14 system review of systems performed and notable only for as above All other review of systems were negative.   ALLERGIES: No Known Allergies  HOME MEDICATIONS: Current Outpatient Medications  Medication Sig Dispense Refill   atorvastatin (LIPITOR) 40 MG tablet Take 1 tablet (40 mg total) by mouth daily. 90 tablet 3   Cascara Sagrada 450 MG CAPS Take 1 each by mouth daily.     Cholecalciferol (VITAMIN D3) 125 MCG (5000 UT) CAPS Take 1 each by mouth daily.     dicyclomine (BENTYL) 10 MG capsule Take 10 mg by mouth 4 (four) times daily -  before meals and at bedtime.     hydrochlorothiazide (MICROZIDE) 12.5 MG capsule TAKE 1 CAPSULE BY MOUTH EVERY DAY 90 capsule 3   pyridOXINE (VITAMIN B-6) 100 MG tablet Take 100 mg by mouth daily.     vitamin B-12 (CYANOCOBALAMIN) 500 MCG tablet Take 500 mcg by mouth daily.     No current facility-administered medications for this visit.    PAST MEDICAL HISTORY: Past Medical History:  Diagnosis Date   Arthritis    Hypertension     PAST SURGICAL HISTORY: Past Surgical History:  Procedure Laterality Date   ABDOMINAL HYSTERECTOMY      FAMILY HISTORY: Family History  Problem Relation Age of Onset   Diabetes Sister    Hypertension Sister    Diabetes Brother    Hypertension Brother    Stroke Brother    Diabetes Brother    Hypertension Father    Hypertension Mother     SOCIAL HISTORY: Social History   Socioeconomic History   Marital status: Single    Spouse name: Not on file   Number of children: Not on file   Years of education: Not on file   Highest education level: Not on file  Occupational History    Not on file  Tobacco Use   Smoking status: Never   Smokeless tobacco: Never  Substance and Sexual Activity   Alcohol use: Yes    Comment: ocassionally   Drug use: Not on file   Sexual activity: Not on file  Other Topics Concern   Not on file  Social History Narrative   Not on file   Social Determinants of Health   Financial Resource Strain: Not on file  Food Insecurity: Not on file  Transportation Needs: Not on file  Physical Activity: Not on file  Stress: Not on file  Social Connections: Not on file  Intimate Partner Violence: Not on file      Levert Feinstein, M.D. Ph.D.  [facility] [address], [address] [place], [place] [address] Ph: [phone] Fax: [phone]  CC:  Fayette Pho, MD [address]  [place],  [place] [address]  Fayette Pho, MD

## 2021-03-25 ENCOUNTER — Ambulatory Visit: Payer: Medicaid Other | Admitting: Licensed Clinical Social Worker

## 2021-03-25 DIAGNOSIS — Z7189 Other specified counseling: Secondary | ICD-10-CM

## 2021-03-25 NOTE — Patient Instructions (Signed)
Licensed Clinical Social Worker Visit Information  Goals we discussed today:   Goals Addressed             This Visit's Progress    Find Help in My Community       Timeframe:  Short-Term Goal Priority:  High Start Date: 03/25/2021                            Expected End Date:                       Follow Up Date 04/15/2021   Patient Goals/Self-Care Activities: Call Department of Social Services [phone] to apply for utility assistacne  Call [employer] [phone]  Work with the [facility] [place] team on community resource needs I have placed a referral they will contact you about resources for your mortgage Keep appointment in Nov. With [facility]  Review EMMI education video on Breathing to Relax; preparing for a pain consolation and coping with a health condition        Ms. Riederer was given information about Care Management services today including:  Care Management services include personalized support from designated clinical staff supervised by her physician, including individualized plan of care and coordination with other care providers 24/7 contact phone numbers for assistance for urgent and routine care needs. The patient may stop Care Management services at any time by phone call to the office staff.  Patient agreed to services and verbal consent obtained.   Follow up plan: SW will follow up with patient by phone over the next 3 weeks  Sammuel Hines, LCSW Care Management & Coordination  [facility] / [employer]   [phone]

## 2021-03-25 NOTE — Chronic Care Management (AMB) (Signed)
Care Management Clinical Social Work Note  03/25/2021 Name: Jackie Holden MRN: [id number] DOB: Mar 15, 1961  Jackie Holden is a 60 y.o. year old female who is a primary care patient of Fayette Pho, MD.  The Care Management team was consulted for assistance with chronic disease management and coordination needs.  Engaged with patient by telephone for initial visit in response to provider referral for social work chronic care management and care coordination services  Consent to Services:  Ms. Vensel was given information about Care Management services today including:  Care Management services includes personalized support from designated clinical staff supervised by her physician, including individualized plan of care and coordination with other care providers 24/7 contact phone numbers for assistance for urgent and routine care needs. The patient may stop case management services at any time by phone call to the office staff. Patient agreed to services and consent obtained.   Assessment: .   Patient has applied for [facility] card, has mental health appointment with [facility], has applied for disability; connected with [facility], referral made to [facility] pharmacy team, and consultation message to PCP for patient concerns with starting depression medication and referral for pain management .  See Care Plan below for interventions and patient self-care actives. Recent life changes or stressors: unable to work due to pain  Follow up Plan: Patient would like continued follow-up from CCM LCSW .  per patient's request will follow up in 3 weeks.  Will call office if needed prior to next encounter.   Review of patient past medical history, allergies, medications, and health status, including review of relevant consultants reports was performed today as part of a comprehensive evaluation and provision of chronic care management and care coordination services.  SDOH  (Social Determinants of Health) assessments and interventions performed:  SDOH Interventions    Flowsheet Row Most Recent Value  SDOH Interventions   SDOH Interventions for the Following Domains Depression  Financial Strain Interventions [id number] Referral  Housing Interventions [id number] Referral  Stress Interventions Provide Counseling  Transportation Interventions [facility]  Depression Interventions/Treatment  Referral to Psychiatry, Counseling        Advanced Directives Status: Not addressed in this encounter.  Care Plan  No Known Allergies  Outpatient Encounter Medications as of 03/25/2021  Medication Sig   atorvastatin (LIPITOR) 40 MG tablet Take 1 tablet (40 mg total) by mouth daily.   Cascara Sagrada 450 MG CAPS Take 1 each by mouth daily.   Cholecalciferol (VITAMIN D3) 125 MCG (5000 UT) CAPS Take 1 each by mouth daily.   dicyclomine (BENTYL) 10 MG capsule Take 10 mg by mouth 4 (four) times daily -  before meals and at bedtime.   hydrochlorothiazide (MICROZIDE) 12.5 MG capsule TAKE 1 CAPSULE BY MOUTH EVERY DAY   pyridOXINE (VITAMIN B-6) 100 MG tablet Take 100 mg by mouth daily.   vitamin B-12 (CYANOCOBALAMIN) 500 MCG tablet Take 500 mcg by mouth daily.   No facility-administered encounter medications on file as of 03/25/2021.    Patient Active Problem List   Diagnosis Date Noted   Low back pain 03/23/2021   Neck pain 03/23/2021   Routine screening for STI (sexually transmitted infection) 02/14/2021   Screening for breast cancer 02/14/2021   Severe diverticulosis of colon 01/08/2021   Colon polyp 01/08/2021   Anxiety and depression 12/17/2020   Screen for colon cancer 12/17/2020   Financial problems 12/16/2020   Insurance coverage problems 12/16/2020   Spinal cord stimulator status  12/16/2020   Assistance needed with transportation 12/16/2020   Tinea pedis of both feet 11/21/2018   Chronic neck pain 08/11/2016   Dyslipidemia (high LDL; low  HDL) 12/15/2015   Well woman exam 12/07/2013   Lower back pain 02/08/2012   POTENTIAL CARIES 10/31/2009   OBESITY, UNSPECIFIED 06/16/2009   ESSENTIAL HYPERTENSION, BENIGN 06/16/2009    Conditions to be addressed/monitored: Anxiety and Depression;   Care Plan : General Social Work (Adult)  Updates made by [name], LCSW since 03/25/2021 12:00 AM     Problem: Emotional Distress      Goal: Emotional Health Supported   Start Date: 03/25/2021  This Visit's Progress: On track  Priority: High  Note:   Current Barriers:  Disease Management support and education needs related to Depression: depressed mood loss of energy/fatigue feelings of worthlessness, guilt Housing , Transportation, Corporate treasurer , and Stress No health insurance   CSW Clinical Goal(s):  Patient will verbalize understanding of plan for management of Depression  explore community resource options for unmet needs related to:  Housing , Transportation, Corporate treasurer , and Stress through collaboration with [employer], provider, and care team.   Interventions: Inter-disciplinary care team collaboration (see longitudinal plan of care) Evaluation of current treatment plan related to  self management and patient's adherence to plan as established by provider  Mental Health:  (Status: New goal.) Assessed needs, previous treatment, barriers to care, support system, copying skills Evaluation of current treatment plan related to Depression:   Active listening / Reflection utilized  Emotional Support Provided Problem Solving /Task Center strategies reviewed Participation in counseling encouraged  Suicidal Ideation/Homicidal Ideation assessed: Discussed referral for psychiatry  Collaboration message sent to PCP per patient's request to inquire about starting medication  EMMI education on : Breathing to Relax; preparing for a pain consolation and coping with a health condition     Social  Determinants of Health in Patient with symptoms of Depression:   :  (Status: New goal.) SDOH assessments completed: Housing , Transportation, Corporate treasurer , and Stress [employer] with patient on the line to register with [facility]  Referral made to [facility] pharmacy team to assist patient with affording medication Consideration to send Rx to [facility]  Patient Goals/Self-Care Activities: Call Department of Social Services [phone] to apply for [employer] [phone]  Work with the [facility] team on community resource needs I have placed a referral they will contact you about resources for your mortgage Keep appointment in Nov. With [facility] Review EMMI education video on Breathing to Relax; preparing for a pain consolation and coping with a health condition    Follow Up Plan: Telephone follow up appointment with care management team member scheduled for:      Sammuel Hines, LCSW Care Management & Coordination  [facility] / [employer]   [phone]

## 2021-03-26 ENCOUNTER — Telehealth: Payer: Self-pay | Admitting: Family Medicine

## 2021-03-26 ENCOUNTER — Telehealth: Payer: Self-pay

## 2021-03-26 NOTE — Telephone Encounter (Signed)
   Jackie Holden DOB: 1961-06-03 MRN: [id number]   RIDER WAIVER AND RELEASE OF LIABILITY  For purposes of improving physical access to our facilities, [facility] is pleased to partner with third parties to provide [facility] patients or other authorized individuals the option of convenient, on-demand ground transportation services (the [employer]") through use of the technology service that enables users to request on-demand ground transportation from independent third-party providers.  By opting to use and accept these [employer], I, the undersigned, hereby agree on behalf of myself, and on behalf of any minor child using the Science writer for whom I am the parent or legal guardian, as follows:  Science writer provided to me are provided by independent third-party transportation providers who are not [employer] or employees and who are unaffiliated with [employer]. [facility] is neither a transportation carrier nor a common or public carrier. [facility] has no control over the quality or safety of the transportation that occurs as a result of the [employer]. [facility] cannot guarantee that any third-party transportation provider will complete any arranged transportation service. [facility] makes no representation, warranty, or guarantee regarding the reliability, timeliness, quality, safety, suitability, or availability of any of the Transport Services or that they will be error free. I fully understand that traveling by vehicle involves risks and dangers of serious bodily injury, including permanent disability, paralysis, and death. I agree, on behalf of myself and on behalf of any minor child using the Transport Services for whom I am the parent or legal guardian, that the entire risk arising out of my use of the [employer] remains solely with me, to the maximum extent permitted under applicable law. The [employer] are provided  "as is" and "as available." [facility] disclaims all representations and warranties, express, implied or statutory, not expressly set out in these terms, including the implied warranties of merchantability and fitness for a particular purpose. I hereby waive and release [facility], its agents, employees, officers, directors, representatives, insurers, attorneys, assigns, successors, subsidiaries, and affiliates from any and all past, present, or future claims, demands, liabilities, actions, causes of action, or suits of any kind directly or indirectly arising from acceptance and use of the [employer]. I further waive and release [facility] and its affiliates from all present and future liability and responsibility for any injury or death to persons or damages to property caused by or related to the use of the [employer]. I have read this Waiver and Release of Liability, and I understand the terms used in it and their legal significance. This Waiver is freely and voluntarily given with the understanding that my right (as well as the right of any minor child for whom I am the parent or legal guardian using the [employer]) to legal recourse against  in connection with the [employer] is knowingly surrendered in return for use of these services.   I attest that I read the consent document to Jackie Holden, gave Jackie Holden the opportunity to ask questions and answered the questions asked (if any). I affirm that Jackie Holden then provided consent for she's participation in this program.     Jackie Holden

## 2021-03-26 NOTE — Telephone Encounter (Signed)
Left vm with pt suggesting [facility] pharmacy to fill medications since she is uninsured. Gave my call back number to discuss more in detail [phone]

## 2021-03-27 ENCOUNTER — Ambulatory Visit
Admission: RE | Admit: 2021-03-27 | Discharge: 2021-03-27 | Disposition: A | Discharge status: Home / Self Care | Payer: Medicaid Other | Source: Ambulatory Visit | Attending: Family Medicine | Admitting: Family Medicine

## 2021-03-27 ENCOUNTER — Other Ambulatory Visit: Payer: Self-pay

## 2021-03-27 ENCOUNTER — Ambulatory Visit: Payer: Medicaid Other

## 2021-03-27 DIAGNOSIS — Z1231 Encounter for screening mammogram for malignant neoplasm of breast: Secondary | ICD-10-CM

## 2021-03-30 ENCOUNTER — Telehealth: Payer: Self-pay

## 2021-03-30 ENCOUNTER — Ambulatory Visit: Payer: Medicaid Other | Admitting: Licensed Clinical Social Worker

## 2021-03-30 ENCOUNTER — Other Ambulatory Visit: Payer: Self-pay

## 2021-03-30 DIAGNOSIS — Z7189 Other specified counseling: Secondary | ICD-10-CM

## 2021-03-30 MED ORDER — DICYCLOMINE HCL 10 MG PO CAPS
ORAL_CAPSULE | ORAL | 1 refills | Status: AC
  Filled 2021-03-30: qty 90, 30d supply, fill #0

## 2021-03-30 MED ORDER — HYDROCHLOROTHIAZIDE 12.5 MG PO CAPS
ORAL_CAPSULE | ORAL | 0 refills | Status: DC
  Filled 2021-03-30: qty 30, 30d supply, fill #0

## 2021-03-30 NOTE — Telephone Encounter (Signed)
Pt returned phone call. She is open to transferring her medications to [facility]. I reached out to [facility] pharmacy and they will transfer dicyclomine, HCTZ, & lipitor for her.  Pt will call health dept about flu shot & ask [facility] pharmacy as well.

## 2021-03-30 NOTE — Telephone Encounter (Signed)
Referral faxed to [facility].  They handle pain management and should be able to address her spinal stimulator. Saralee Bolick,CMA

## 2021-03-30 NOTE — Chronic Care Management (AMB) (Signed)
Care Management  Clinical Social Work Note  03/30/2021 Name: Jackie Holden MRN: [id number] DOB: 1960/08/22  Jackie Holden is a 60 y.o. year old female who is a primary care patient of Fayette Pho, MD. The CCM team was consulted for assistance with care coordination needs.   Consent to Services:  The patient was given information about Care Management services, agreed to services, and gave verbal consent prior to initiation of services.  Please see initial visit note for detailed documentation.   Patient agreed to services today and consent obtained.   Assessment: Engaged with patient by phone in response to provider referral for social work care coordination services:  Assessed patient's current treatment, progress, coping skills, support system and barriers to care.  She continues to experience difficulty with getting her medication .Pharmacy has tried to contact patient and not able to reach her. Collaboration with pharmacy team as needed for ongoing needs. See Care Plan below for interventions and patient self-care actives.  Recommendation: Patient may benefit from, and is in agreement in call pharmacy team ( number provided) and call PCP to inquire about referral to pain management.   Follow up Plan: Patient would like continued follow-up from CCM LCSW .  per patient's request will follow up in 2 weeks.  Will call office if needed prior to next encounter.    Review of patient past medical history, allergies, medications, and health status, including review of pertinent consultant reports was performed as part of comprehensive evaluation and provision of care management/care coordination services.   SDOH (Social Determinants of Health) screening and interventions performed today:   Advanced Directives Status:Not addressed in this encounter.     Care Plan    Conditions to be addressed/monitored per PCP order: , Limited social support and Mental Health Concerns   Patient Care  Plan: General Social Work (Adult)     Problem Identified: Emotional Distress      Goal: Emotional Health Supported   Start Date: 03/25/2021  This Visit's Progress: On track  Recent Progress: On track  Priority: High  Note:   Current Barriers:  Disease Management support and education needs related to Depression: depressed mood loss of energy/fatigue feelings of worthlessness, guilt Housing , Transportation, Corporate treasurer , and Stress No health insurance   CSW Clinical Goal(s):  Patient will verbalize understanding of plan for management of Depression  explore community resource options for unmet needs related to:  Housing , Transportation, Corporate treasurer , and Stress through collaboration with Visual merchandiser, provider, and care team.   Interventions: Inter-disciplinary care team collaboration (see longitudinal plan of care) Evaluation of current treatment plan related to  self management and patient's adherence to plan as established by provider  Mental Health:  (Status: New goal.) Assessed needs, previous treatment, barriers to care, support system, copying skills Evaluation of current treatment plan related to Depression:   Active listening / Reflection utilized  Emotional Support Provided Problem Solving /Task Center strategies reviewed Participation in counseling encouraged  Suicidal Ideation/Homicidal Ideation assessed: Discussed referral for psychiatry  Collaboration message sent to PCP per patient's request to inquire about starting medication  EMMI education on : Breathing to Relax; preparing for a pain consolation and coping with a health condition     Social Determinants of Health in Patient with symptoms of Depression:   :  (Status: New goal.) SDOH assessments completed: Housing , Transportation, Corporate treasurer , and Stress [employer] with patient on the line to register with [employer]  Transportation  Referral made to [facility] pharmacy team to  assist patient with affording medication Consideration to send Rx to [facility]  Patient Goals/Self-Care Activities: Call Department of Social Services [phone] to apply for utility assistacne  Call [employer] [phone]  Work with the [place] team on community resource needs I have placed a referral they will contact you about resources for your mortgage Keep appointment in Nov. With [facility] Review EMMI education video on Breathing to Relax; preparing for a pain consolation and coping with a health condition    Follow Up Plan: Telephone follow up appointment with care management team member scheduled for:     Sammuel Hines, LCSW Care Management & Coordination  [facility] / [employer]   [phone]

## 2021-03-30 NOTE — Patient Instructions (Signed)
Visit Information   Goals Addressed             This Visit's Progress    Find Help in My Community   On track    Timeframe:  Short-Term Goal Priority:  High Start Date: 03/25/2021                            Expected End Date:                       Follow Up Date 04/15/2021   Patient Goals/Self-Care Activities: Call Department of Social Services [phone] to apply for utility assistacne  Call [employer] [phone]  Work with the [place] team on community resource needs I have placed a referral they will contact you about resources for your mortgage Keep appointment in Nov. With [facility]  Review EMMI education video on Breathing to Relax; preparing for a pain consolation and coping with a health condition         It was a pleasure speaking with you today. Please call the office if needed Patient verbalizes understanding of instructions provided today.  Please f/u with the pharmacy team  Sammuel Hines, LCSW Care Management & Coordination  [phone]

## 2021-03-30 NOTE — Telephone Encounter (Signed)
Patient LVM on nurse line in regards to being referred to a pain specialist. A referral was placed back in June, however was denied. Will forward to Jazmin to FU.

## 2021-03-31 ENCOUNTER — Other Ambulatory Visit: Payer: Self-pay

## 2021-03-31 ENCOUNTER — Ambulatory Visit (INDEPENDENT_AMBULATORY_CARE_PROVIDER_SITE_OTHER): Payer: Self-pay

## 2021-03-31 ENCOUNTER — Telehealth: Payer: Self-pay

## 2021-03-31 DIAGNOSIS — Z23 Encounter for immunization: Secondary | ICD-10-CM

## 2021-03-31 NOTE — Progress Notes (Signed)
Patient presents to nurse clinic for flu vaccination. Administered in LD, site unremarkable, tolerated injection well.   Lashundra Shiveley C Brenyn Petrey, RN   

## 2021-03-31 NOTE — Telephone Encounter (Signed)
Patient left message requesting return call regarding [facility]CP. Call returned, left message on voicemail, requesting return call.

## 2021-04-15 ENCOUNTER — Ambulatory Visit: Payer: Medicaid Other | Admitting: Licensed Clinical Social Worker

## 2021-04-15 DIAGNOSIS — Z7189 Other specified counseling: Secondary | ICD-10-CM

## 2021-04-15 NOTE — Chronic Care Management (AMB) (Signed)
Care Management  Clinical Social Work Note  04/15/2021 Name: Jackie Holden MRN: [id number] DOB: [birth year]  Jackie Holden is a 60 y.o. year old female who is a primary care patient of Fayette Pho, MD. The CCM team was consulted for assistance with care coordination needs. [employer] , Mental Health Counseling and Resources, and Financial Difficulties related to no income    Consent to Services:  The patient was given information about Care Management services, agreed to services, and gave verbal consent prior to initiation of services.  Please see initial visit note for detailed documentation.   Patient agreed to services today and consent obtained.  Engaged with patient by telephone for follow up visit in response to provider referral for social work care coordination services.   Assessment/Interventions: Assessed patient's current treatment, progress, coping skills, support system and barriers to care.  Patient has completed all task connected to accomplishing care plan goal She now has the orange card, medication [facility]; has reviewed all EMMI education, utilized [employer] to medical appointments, has spoken to DSS and other agencies about assistance with bills.  Patient continues to experience difficulty with connecting with pain clinic.  Referral placed by [facility] and patient had previous provider fax her records . See Care Plan below for interventions and patient self-care actives.  Recommendation: Patient may benefit from, and is in agreement call to schedule appointment with pain clinic ( referral faxed by [facility]) and f/u to see if records have been received.  In addition she will connect agencies discussed to connect with support groups for emotional health while she waits for therapy appointment with [facility] in Nov.   Follow up Plan: Patient would like continued follow-up from CCM LCSW .  per patient's request  will follow up in 3 weeks.  Will call office if needed prior to next encounter.   Review of patient past medical history, allergies, medications, and health status, including review of pertinent consultant reports was performed as part of comprehensive evaluation and provision of care management/care coordination services.   SDOH (Social Determinants of Health) screening and interventions performed today:   Advanced Directives Status:Not addressed in this encounter.     Care Plan    Conditions to be addressed/monitored per PCP order: Depression,  community support  Care Plan : General Social Work (Adult)  Updates made by [name], LCSW since 04/15/2021 12:00 AM     Problem: Emotional Distress      Goal: Emotional Health Supported   Start Date: 03/25/2021  This Visit's Progress: On track  Recent Progress: On track  Priority: High  Note:   Current Barriers:  Disease Management support and education needs related to Depression: depressed mood loss of energy/fatigue feelings of worthlessness, guilt Housing , Transportation, Corporate treasurer , and Stress No health insurance   CSW Clinical Goal(s):  Patient will verbalize understanding of plan for management of Depression  explore community resource options for unmet needs related to:  Housing , Transportation, Corporate treasurer , and Stress through collaboration with Visual merchandiser, provider, and care team.   Interventions: Inter-disciplinary care team collaboration (see longitudinal plan of care) Evaluation of current treatment plan related to  self management and patient's adherence to plan as established by provider  Mental Health:  (Status: Goal on track: YES.) Assessed needs, previous treatment, barriers to care, support system, copying skills Evaluation of current treatment plan related to Depression:   Solution-Focused Strategies employed: connecting  to [employer] and [employer]  Active  listening / Reflection utilized  Emotional Support Provided Problem Solving [name] strategies reviewed Participation in counseling encouraged  Participation in support group encouraged  Discussed referral for psychiatry Collaboration message sent to PCP per patient's request to inquire about starting medication  Reviewed EMMI education on : Breathing to Relax; preparing for a pain consolation and coping with a health condition  Provided new EMMI education on Insomnia and emotional health    Social Determinants of Health in Patient with symptoms of Depression:   :  (Status: Goal on track: YES.) SDOH assessments completed: Housing , Transportation, Corporate treasurer , and Stress Using [employer]  Rx have been sent to [employer] [facility] able to afford medication  Patient Goals/Self-Care Activities: Keep appointment in Nov. With [facility] Review new EMMI education video on INSOMNIA AND GETTING A GOOD NIGHT'S SLEEP and MOVEMENT: EMOTIONAL TEFL teacher the agencies below for support groups [facility] :  https://womenscentergso.org/    Phone: [phone] [facility] [phone]    [email or web address]          Sammuel Hines, LCSW Care Management & Coordination  [facility] / [employer]   [phone]

## 2021-04-15 NOTE — Patient Instructions (Signed)
Visit Information   Goals Addressed             This Visit's Progress    Find Help in My Community   On track    Timeframe:  Short-Term Goal Priority:  High Start Date: 03/25/2021                            Expected End Date:                        Patient Goals/Self-Care Activities: Keep appointment in Nov. With [facility] Review new EMMI education video on INSOMNIA AND GETTING A GOOD NIGHT'S SLEEP and MOVEMENT: EMOTIONAL TEFL teacher the agencies below for support groups [facility] :  https://womenscentergso.org/    Phone: [phone] [facility] [phone]    [email or web address]           The patient verbalized understanding of instructions, educational materials, and care plan provided today and declined offer to receive copy of patient instructions, educational materials, and care plan.   It was a pleasure speaking with you today. Please call the office if needed Patient verbalizes understanding of instructions provided today.  Follow up appointment is scheduled 05/06/2021  Sammuel Hines, LCSW Care Management & Coordination  [phone]

## 2021-04-16 ENCOUNTER — Other Ambulatory Visit: Payer: Self-pay

## 2021-04-16 DIAGNOSIS — I10 Essential (primary) hypertension: Secondary | ICD-10-CM

## 2021-04-17 ENCOUNTER — Other Ambulatory Visit: Payer: Self-pay

## 2021-04-17 MED ORDER — HYDROCHLOROTHIAZIDE 12.5 MG PO CAPS
ORAL_CAPSULE | ORAL | 1 refills | Status: DC
  Filled 2021-04-17 – 2021-06-25 (×5): qty 30, 30d supply, fill #0
  Filled 2021-06-25: qty 30, 30d supply, fill #1
  Filled 2021-06-25: qty 30, 30d supply, fill #0

## 2021-04-22 ENCOUNTER — Other Ambulatory Visit: Payer: Self-pay

## 2021-04-22 ENCOUNTER — Telehealth: Payer: Self-pay | Admitting: Family Medicine

## 2021-04-22 ENCOUNTER — Other Ambulatory Visit: Payer: Medicaid Other

## 2021-04-22 DIAGNOSIS — E785 Hyperlipidemia, unspecified: Secondary | ICD-10-CM

## 2021-04-22 NOTE — Telephone Encounter (Signed)
Called patient back and let her know that I have reached out to Dr. Allena Katz about her pain management.  She was agreeable with this plan.  Pt doesn't currently have insurance but does have [facility] discount, so I will also send her referral to them.  If she gets in at [facility], she can apply for their charity care.  Lorra Freeman,CMA

## 2021-04-22 NOTE — Telephone Encounter (Signed)
Patient would like to have her referral for pain management to be sent to another office. She was contacted to the original office it was sent to and they said she had been denied. They would not tell her why it was denied. I told her it could be due to them not taking medicaid but I was not positive.   Please let patient know when referral has been placed.

## 2021-04-22 NOTE — Telephone Encounter (Signed)
Spoke with Dr. Eliane Decree office and was able to make patient an appt for 05/25/21 at 930am.  She will need to contact the stimulator manufacturer, [employer], as they will likely come to this appointment too.  LM for patient with this information.  [facility] [address] [place], [place] [address] [phone]  Prospective and Existing Patient Education and Support p: [phone]

## 2021-04-23 ENCOUNTER — Encounter: Payer: Self-pay | Admitting: Family Medicine

## 2021-04-23 LAB — LIPID PANEL
Chol/HDL Ratio: 4.4 ratio (ref 0.0–4.4)
Cholesterol, Total: 141 mg/dL (ref 100–199)
HDL: 32 mg/dL — ABNORMAL LOW (ref >39)
LDL Chol Calc (NIH): 99 mg/dL (ref 0–99)
Triglycerides: 44 mg/dL (ref 0–149)
VLDL Cholesterol Cal: 10 mg/dL (ref 5–40)

## 2021-05-06 ENCOUNTER — Telehealth: Payer: Self-pay | Admitting: Licensed Clinical Social Worker

## 2021-05-06 ENCOUNTER — Ambulatory Visit: Payer: Medicaid Other | Admitting: Licensed Clinical Social Worker

## 2021-05-06 DIAGNOSIS — Z7189 Other specified counseling: Secondary | ICD-10-CM

## 2021-05-06 NOTE — Chronic Care Management (AMB) (Signed)
    Clinical Social Work  Care Management   Phone Outreach    05/06/2021 Name: BRITTANE GRUDZINSKI MRN: [id number] DOB: 07-29-1960  TYARA DASSOW is a 60 y.o. year old female who is a primary care patient of Fayette Pho, MD .   Reason for referral: Community Resources  and Mental Health Counseling and Resources.    F/U phone call today to assess needs, progress and barriers with care plan goals.   Telephone outreach was unsuccessful. A HIPPA compliant phone message was left for the patient providing contact information and requesting a return call.   Plan:CCM LCSW will wait for return call. If no return call is received, Will route chart to Care Guide to see if patient would like to reschedule phone appointment   Review of patient status, including review of consultants reports, relevant laboratory and other test results, and collaboration with appropriate care team members and the patient's provider was performed as part of comprehensive patient evaluation and provision of care management services.    Sammuel Hines, LCSW Care Management & Coordination  [facility] / [employer]   [phone]

## 2021-05-06 NOTE — Chronic Care Management (AMB) (Signed)
Care Management  Clinical Social Work Note  05/06/2021 Name: Jackie Holden MRN: [id number] DOB: 05/16/1961  Jackie Holden is a 60 y.o. year old female who is a primary care patient of Fayette Pho, MD. The CCM team was consulted for assistance with care coordination needs. [employer] , Art therapist, and Mental Health Counseling and Resources   Consent to Services:  The patient was given information about Care Management services, agreed to services, and gave verbal consent prior to initiation of services.  Please see initial visit note for detailed documentation.   Patient agreed to services today and consent obtained.  Engaged with patient by telephone for follow up visit in response to provider referral for social work care coordination services.   Assessment/Interventions: Assessed patient's current treatment, progress, coping skills, support system and barriers to care.  She is making progress with connecting to community support. Is scheduled with mental health provider and pain clinic .  See Care Plan below for interventions and patient self-care actives.  Follow up Plan:  Patient does not require or desire continued follow-up by LCSW. Will contact the office if needed Patient may benefit from and is in agreement for CCM LCSW to remain part of care team for the next 30 days.  If no needs are identified in the next 30 days, CCM LCSW will disconnect from the care team.   Review of patient past medical history, allergies, medications, and health status, including review of pertinent consultant reports was performed as part of comprehensive evaluation and provision of care management/care coordination services.   SDOH (Social Determinants of Health) screening and interventions performed today:   Advanced Directives Status:See Care Plan for related entries     Care Plan    Conditions to be addressed/monitored per PCP order: Depression,   Care Plan :  General Social Work (Adult)  Updates made by [name], LCSW since 05/06/2021 12:00 AM     Problem: Emotional Distress      Goal: Emotional Health Supported   Start Date: 03/25/2021  This Visit's Progress: On track  Recent Progress: On track  Priority: High  Note:   Current Barriers:  Disease Management support and education needs related to Depression: depressed mood loss of energy/fatigue feelings of worthlessness, guilt Housing , Transportation, Corporate treasurer , and Stress No health insurance   CSW Clinical Goal(s):  Patient will verbalize understanding of plan for management of Depression  explore community resource options for unmet needs related to:  Housing , Transportation, Corporate treasurer , and Stress through collaboration with Visual merchandiser, provider, and care team.   Interventions: Inter-disciplinary care team collaboration (see longitudinal plan of care) Evaluation of current treatment plan related to  self management and patient's adherence to plan as established by provider  Mental Health:  (Status: Goal on track: YES.) Solution-Focused Strategies employed: connecting to [employer] and [employer]  Active listening / Reflection utilized  Problem Solving [name] strategies reviewed Participation in support group encouraged / has decided not to move forward at this time Reviewed EMMI education on Insomnia and emotional health    Social Determinants of Health in Patient with symptoms of Depression:   :  (Status: Goal on track: YES.) SDOH assessments completed: Housing , Transportation, Corporate treasurer , and Stress Using [employer]  Rx have been sent to [facility] and [facility]ness able to afford medication  Patient Goals/Self-Care Activities: Keep appointment in Nov. With [facility] Continue to review  new EMMI education video on INSOMNIA AND GETTING A GOOD NIGHT'S SLEEP and MOVEMENT:  EMOTIONAL HEALTH Contact the agencies below for support groups when you are ready [facility] Resource [facility] :  https://womenscentergso.org/    Phone: [phone] [facility] [phone]    [email or web address]          Problem: No advance directive      Goal: Effective Long-Term Care Planning   Start Date: 05/06/2021  This Visit's Progress: On track  Priority: High  Note:   Current barriers:   Patient does not have an advance directive.  Needs education, support and coordination in order to meet this need. Clinical Goal(s): Over the next 30 days, the patient will review information on advance directive, complete advance directive packet and have notarized.  Interventions: Assessed understanding of Advance Directives. A voluntary discussion about advanced care planning including importance of advanced directives, healthcare proxy and living will was discussed with the patient.  E-mailed EMMI educational information on Advance Directives as well as an advance directive packet mailed  Inter-disciplinary care team collaboration (see longitudinal plan of care) Patient Goals/Self-Care Activities : Over the next 30 days Review educational information on Advance Directive  Complete Advance Directive packet,  Have advance directive notarized and provide a copy to provider office Call LCSW if you have questions     Sammuel Hines, LCSW Care Management & Coordination  [facility] / [employer]   [phone]

## 2021-05-06 NOTE — Patient Instructions (Signed)
Licensed Clinical Social Worker Visit Information  Goals we discussed today:   Goals Addressed             This Visit's Progress    Effective Long-Term Care Planning       Patient Goals/Self-Care Activities : Over the next 30 days Review educational information on Advance Directive  Complete Advance Directive packet,  Have advance directive notarized and provide a copy to provider office Call LCSW if you have questions        Find Help in My Community   On track    Timeframe:  Short-Term Goal Priority:  High Start Date: 03/25/2021                            Expected End Date:                        Patient Goals/Self-Care Activities: Keep appointment in Nov. With [facility] Continue to review new EMMI education video on INSOMNIA AND GETTING A GOOD NIGHT'S SLEEP and MOVEMENT: EMOTIONAL HEALTH Contact the agencies below for support groups when you are ready [facility] :  https://womenscentergso.org/    Phone: [phone] [facility] [phone]    [email or web address]          Materials provided: Yes: see above  Patient agreed to services and verbal consent obtained.   The patient verbalized understanding of instructions, educational materials, and care plan provided today and declined offer to receive copy of patient instructions, educational materials, and care plan.   It was a pleasure speaking with you today. Please call the office if needed No follow up scheduled, per our conversation you do not require or desire continued follow up Per our conversation I will remain part of your care team for the next 30 days.  If no needs are identified in the next 30 days, I will disconnect from the care team.  Sammuel Hines, LCSW Care Management & Coordination  [phone]

## 2021-05-07 ENCOUNTER — Ambulatory Visit: Payer: Medicaid Other | Attending: Internal Medicine

## 2021-05-07 DIAGNOSIS — Z23 Encounter for immunization: Secondary | ICD-10-CM

## 2021-05-08 NOTE — Progress Notes (Signed)
   Covid-19 Vaccination Clinic  Name:  Jackie Holden    MRN: [id number] DOB: Feb 23, 1961  05/08/2021  [name] was observed post Covid-19 immunization for 15 minutes without incident. She was provided with Vaccine Information Sheet and instruction to access the V-Safe system.   [name] was instructed to call 911 with any severe reactions post vaccine: Difficulty breathing  Swelling of face and throat  A fast heartbeat  A bad rash all over body  Dizziness and weakness   Immunizations Administered     Name Date Dose VIS Date Route   Pfizer Covid-19 Vaccine Bivalent Booster 05/07/2021  2:46 PM 0.3 mL 02/18/2021 Intramuscular   Manufacturer: [employer], [employer]   Lot: [id number]   NDC: [phone]

## 2021-05-20 ENCOUNTER — Other Ambulatory Visit: Payer: Self-pay

## 2021-05-20 ENCOUNTER — Ambulatory Visit (INDEPENDENT_AMBULATORY_CARE_PROVIDER_SITE_OTHER): Payer: No Payment, Other | Admitting: Licensed Clinical Social Worker

## 2021-05-20 DIAGNOSIS — F331 Major depressive disorder, recurrent, moderate: Secondary | ICD-10-CM

## 2021-05-23 ENCOUNTER — Other Ambulatory Visit (HOSPITAL_BASED_OUTPATIENT_CLINIC_OR_DEPARTMENT_OTHER): Payer: Self-pay

## 2021-05-23 MED ORDER — PFIZER COVID-19 VAC BIVALENT 30 MCG/0.3ML IM SUSP
INTRAMUSCULAR | 0 refills | Status: AC
  Filled 2021-05-23: qty 0.3, 1d supply, fill #0

## 2021-05-25 ENCOUNTER — Ambulatory Visit: Payer: Medicaid Other | Admitting: Licensed Clinical Social Worker

## 2021-05-25 ENCOUNTER — Other Ambulatory Visit: Payer: Self-pay

## 2021-05-25 ENCOUNTER — Other Ambulatory Visit (HOSPITAL_BASED_OUTPATIENT_CLINIC_OR_DEPARTMENT_OTHER): Payer: Self-pay

## 2021-05-25 DIAGNOSIS — Z7189 Other specified counseling: Secondary | ICD-10-CM

## 2021-05-25 DIAGNOSIS — Z719 Counseling, unspecified: Secondary | ICD-10-CM

## 2021-05-25 MED ORDER — MELOXICAM 15 MG PO TABS
15.0000 mg | ORAL_TABLET | Freq: Every day | ORAL | 2 refills | Status: AC
  Filled 2021-05-25: qty 30, 30d supply, fill #0

## 2021-05-25 NOTE — Patient Instructions (Signed)
Visit Information  Thank you for taking time to visit with me today. Please don't hesitate to contact me if I can be of assistance to you before our next scheduled telephone appointment.  Following are the goals we discussed today: counseling goal, Self-Care Activities: Keep appointments With [facility] Continue to review new EMMI education video on INSOMNIA AND GETTING A GOOD NIGHT'S SLEEP and MOVEMENT: EMOTIONAL HEALTH Contact the agencies below for support groups when you are ready [facility] :  [email or web address].org/    Phone: [phone] [facility] [phone]    [email or web address]      Please call the care guide team at [phone] if you need to cancel or reschedule your appointment.   If you are experiencing a Mental Health or Behavioral Health Crisis or need someone to talk to, please call the Suicide and Crisis Lifeline: 988 call the [place] National Suicide Prevention Lifeline: 409-847-7349 or TTY: (316)247-7324 TTY (831) 258-1128) to talk to a trained counselor call 1-800-273-TALK (toll free, 24 hour hotline) go to [facility] [address] [place] [phone]) call 911   The patient verbalized understanding of instructions, educational materials, and care plan provided today and declined offer to receive copy of patient instructions, educational materials, and care plan.   No follow up scheduled, per our conversation you do not require or desire continued follow up Per our conversation I will remain part of your care team for the next 15 days.  If no needs are identified in the next 15 days, I will disconnect from the care team.  Sammuel Hines, LCSW Care Management & Coordination  [phone]

## 2021-05-25 NOTE — Chronic Care Management (AMB) (Signed)
Care Management  Clinical Social Work Note  05/25/2021 Name: Jackie Holden MRN: [id number] DOB: 29-Dec-1960  GRISELL BISSETTE is a 60 y.o. year old female who is a primary care patient of Ezequiel Essex, MD. The CCM team was consulted for assistance with care coordination needs. IT sales professional and Resources   Consent to Services:  The patient was given information about Care Management services, agreed to services, and gave verbal consent prior to initiation of services.  Please see initial visit note for detailed documentation.   Patient agreed to services today and consent obtained.  Engaged with patient by telephone for follow up visit in response to provider referral for social work care coordination services. Collaborated with [facility] referral coordinator and CCM RN in order to meet patient's ongoing needs ref her concern with not being able to go to a pain management provider..  Assessment/Interventions: No insurance, pending disability, has orange card and [facility] Financial Assistance   Review of patient past medical history, allergies, medications, and health status, including review of pertinent consultant reports was performed as part of comprehensive evaluation and provision of care management/care coordination services.  See Care Plan below for interventions and patient self-care actives.   SDOH (Social Determinants of Health) screening and interventions performed today:   Advanced Directives Status:Not addressed in this encounter.     Care Plan    Conditions to be addressed/monitored per PCP order:  Pain ,  No insurance   Care Plan : General Social Work (Adult)  Updates made by Maurine Cane, LCSW since 05/25/2021 12:00 AM     Problem: Emotional Distress      Goal: Emotional Health Supported Completed 05/25/2021  Start Date: 03/25/2021  This Visit's Progress: On track  Recent Progress: On track  Priority: High  Note:   Current  Barriers:  Disease Management support and education needs related to Depression: depressed mood loss of energy/fatigue feelings of worthlessness, guilt Housing , Transportation, Sales promotion account executive , and Stress No health insurance   CSW Clinical Goal(s):  Patient will verbalize understanding of plan for management of Depression  explore community resource options for unmet needs related to:  Housing , Transportation, Sales promotion account executive , and Stress through collaboration with [place] representative, provider, and care team.   Interventions: Inter-disciplinary care team collaboration (see longitudinal plan of care) Evaluation of current treatment plan related to  self management and patient's adherence to plan as established by provider  Mental Health:  (Status: Goal on Track (progressing): YES. No Needs Identified this visit Goal completed ) Solution-Focused Strategies employed: connecting to [employer] and [place] listening / Reflection utilized  [place] strategies reviewed Participation in support group encouraged / has decided not to move forward at this time Reviewed EMMI education on Insomnia and emotional health    Social Determinants of Health in Patient with symptoms of Depression:   :  (Status: Goal on Track (progressing): YES. No Needs Identified this visit) SDOH assessments completed: Housing , Transportation, Sales promotion account executive , and Stress Using [employer]  Rx have been sent to [place] able to afford medication  Patient Goals/Self-Care Activities: Keep appointments With [employer] Continue to review new EMMI education video on [place] NIGHT'S SLEEP and MOVEMENT: [place] the agencies below for support groups when you are ready [facility] :  [email or web address]/    Phone: [place] [phone]  [email or web address]       Follow up Plan:  All care plan goals for LCSW have been met.  Will disconnect from care team collaboration with [facility] Referral coordinator and [place], [place] / [place]   [phone]

## 2021-05-26 ENCOUNTER — Ambulatory Visit: Payer: Medicaid Other | Admitting: Licensed Clinical Social Worker

## 2021-05-26 DIAGNOSIS — M545 Low back pain, unspecified: Secondary | ICD-10-CM

## 2021-05-26 DIAGNOSIS — Z7189 Other specified counseling: Secondary | ICD-10-CM

## 2021-05-26 NOTE — Patient Instructions (Signed)
Visit Information  Thank you for taking time to visit with me today. Please don't hesitate to contact me if I can be of assistance to you before our next scheduled telephone appointment.  Following are the goals we discussed today: Connecting you with a pain referral I mailed and e-mail financial information for [facility] Call them for more details on their program   Please call the care guide team at [phone] if you need to cancel or reschedule your appointment.   If you are experiencing a Mental Health or Behavioral Health Crisis or need someone to talk to, please call the Suicide and Crisis Lifeline: 988 go to [facility] [address], [place] [phone])   The patient verbalized understanding of instructions, educational materials, and care plan provided today and declined offer to receive copy of patient instructions, educational materials, and care plan.   Please call the office if needed Per our conversation I will remain part of your care team for the next 14 days.  If no needs are identified in the next 14 days, I will disconnect from the care team.  Sammuel Hines, LCSW Care Management & Coordination  [phone]

## 2021-05-26 NOTE — Chronic Care Management (AMB) (Signed)
Care Management  Clinical Social Work Note  05/26/2021 Name: Jackie Holden MRN: [id number] DOB: 21-Jan-1961  Jackie Holden is a 60 y.o. year old female who is a primary care patient of Fayette Pho, MD. The CCM team was consulted for assistance with care coordination needs.  Barriers with referral to pain clinic.    Consent to Services:  The patient was given information about Care Management services, agreed to services, and gave verbal consent prior to initiation of services.  Please see initial visit note for detailed documentation.   Patient agreed to services today and consent obtained.   Engaged with patient by telephone for follow up visit in response to provider referral for social work care coordination services. Assessed patient's current treatment, progress, coping skills, support system and barriers to care.  Collaborated with [facility] referral coordinator in order to meet patient's ongoing needs. See detailed notes in referral dated 12/16/20.  Assessment: Patient continues to experience difficulty with connecting for pain management due to not having insurance. She has the [facility] card and [employer], however she need  [facility] Financial Assistance to be seen at their clinic.Marland Kitchen LCSW mailed and e-mail patient application for [facility].    Review of patient past medical history, allergies, medications, and health status, including review of pertinent consultant reports was performed as part of comprehensive evaluation and provision of care management/care coordination services. See Care Plan below for interventions and patient self-care actives.   SDOH (Social Determinants of Health) screening and interventions performed today:   Advanced Directives Status:Not addressed in this encounter.     No Care Plan created in this encounter  There are no care plans that you recently modified to display for this patient.  Follow up Plan:  Patient does not  require or desire continued follow-up by CCM LCSW. Will contact the office if needed LCSW will disconnect from care team if no needs are identified in the next 2 weeks.  Sammuel Hines, LCSW Care Management & Coordination  [facility] / [employer]   [phone]

## 2021-05-27 ENCOUNTER — Other Ambulatory Visit: Payer: Self-pay
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
## 2021-06-25 ENCOUNTER — Other Ambulatory Visit (HOSPITAL_COMMUNITY): Payer: Self-pay

## 2021-06-25 ENCOUNTER — Other Ambulatory Visit: Payer: Self-pay

## 2021-06-27 ENCOUNTER — Other Ambulatory Visit: Payer: Self-pay

## 2021-06-30 ENCOUNTER — Other Ambulatory Visit: Payer: Self-pay | Admitting: Family Medicine

## 2021-06-30 ENCOUNTER — Other Ambulatory Visit (HOSPITAL_COMMUNITY): Payer: Self-pay

## 2021-06-30 DIAGNOSIS — I10 Essential (primary) hypertension: Secondary | ICD-10-CM

## 2021-06-30 DIAGNOSIS — E785 Hyperlipidemia, unspecified: Secondary | ICD-10-CM

## 2021-06-30 MED ORDER — HYDROCHLOROTHIAZIDE 12.5 MG PO CAPS
12.5000 mg | ORAL_CAPSULE | Freq: Every day | ORAL | 1 refills | Status: DC
  Filled 2021-06-30 – 2021-07-22 (×3): qty 30, 30d supply, fill #0
  Filled 2021-08-17: qty 30, 30d supply, fill #1
  Filled 2021-08-18: qty 30, 30d supply, fill #0

## 2021-06-30 NOTE — Addendum Note (Signed)
Addended by: Valetta Close on: 06/30/2021 04:58 PM   Modules accepted: Orders

## 2021-07-09 ENCOUNTER — Other Ambulatory Visit: Payer: Medicaid Other

## 2021-07-09 ENCOUNTER — Other Ambulatory Visit: Payer: Self-pay

## 2021-07-09 DIAGNOSIS — I10 Essential (primary) hypertension: Secondary | ICD-10-CM

## 2021-07-10 LAB — BASIC METABOLIC PANEL
BUN/Creatinine Ratio: 17 (ref 9–23)
BUN: 13 mg/dL (ref 6–24)
CO2: 27 mmol/L (ref 20–29)
Calcium: 9.3 mg/dL (ref 8.7–10.2)
Chloride: 100 mmol/L (ref 96–106)
Creatinine, Ser: 0.78 mg/dL (ref 0.57–1.00)
Glucose: 89 mg/dL (ref 70–99)
Potassium: 4.1 mmol/L (ref 3.5–5.2)
Sodium: 141 mmol/L (ref 134–144)
eGFR: 88 mL/min/{1.73_m2} (ref >59)

## 2021-07-21 ENCOUNTER — Other Ambulatory Visit (HOSPITAL_COMMUNITY): Payer: Self-pay

## 2021-07-22 ENCOUNTER — Other Ambulatory Visit (HOSPITAL_COMMUNITY): Payer: Self-pay

## 2021-07-22 ENCOUNTER — Other Ambulatory Visit: Payer: Self-pay

## 2021-07-29 ENCOUNTER — Other Ambulatory Visit: Payer: Self-pay

## 2021-07-29 ENCOUNTER — Ambulatory Visit (INDEPENDENT_AMBULATORY_CARE_PROVIDER_SITE_OTHER): Payer: Self-pay | Admitting: Licensed Clinical Social Worker

## 2021-07-29 DIAGNOSIS — F332 Major depressive disorder, recurrent severe without psychotic features: Secondary | ICD-10-CM

## 2021-07-29 NOTE — Progress Notes (Signed)
° °  THERAPIST PROGRESS NOTE  Session Time: 50 min  Participation Level: Active  Behavioral Response: CasualAlertDepressed  Type of Therapy: Individual Therapy  Treatment Goals addressed: Communication: dep/coping  Interventions: Solution Focused, Supportive, and Other: education  Summary: Jackie Holden is a 61 y.o. female who presents with hx of MDD.  Today patient returns for in person session.  This is the first session since initial session May 20, 2021.  LCSW assessed for significant changes.  [name] states her overall situation is relatively the same.  She reports continued pain with her pain being graded today at a "7" on a scale of 0-10.  Patient reports she did get to have 1 follow-up appointment with pain management at [facility], Dr. Allena Katz.  She reports meloxicam was added and he would like to do another injection in her back but he cannot do that until she has some sort of insurance.  Patient reports dissatisfaction "no help" from the meloxicam.  She is encouraged to call the office to report how the medication is and is not working for her.  Patient advised that she is on the phone weekly with someone from the company related to the stimulator in her back and this person makes adjustments.  Patient reports this is the only reason her pain is down to a 7.  Patient advised she has her Social Security disability hearing on April 11 per her legal counsel.  Patient continues to report very poor sleep.  Patient reports feeling her balance is off and states she had a recent fall which is the third fall she has had since her initial work injury.  Patient agrees to follow-up with her primary care provider. Pt scores 23 on the PHQ2&9. SI screening reveals pt has passive SI. LCSW provided education on SI, 988 and [facility] crisis unit. Pt verbalizes understanding and appreciation for resources. LCSW provided additional edu r/t medication management evaluation at this facility.  Patient  accepting of med eval which was facilitated post session.  When asked, patient reports she did read the grief literature provided at initial session.  She states she read it more than once and it has been helpful.  LCSW provided additional education on feelings of loss and secondary losses.  Patient reports feelings of validation.  LCSW addressed coping strategies. LCSW reviewed poc including scheduling prior to close of session. Pt states appreciation for care.   Suicidal/Homicidal:  intermittentwithout intent/plan  Therapist Response: Pt receptive to care.  Plan: Return again in 3 weeks.  Diagnosis: Axis I: Major Depression, Recurrent severe   [place] Sink, LCSW 07/29/2021

## 2021-08-13 ENCOUNTER — Other Ambulatory Visit (HOSPITAL_BASED_OUTPATIENT_CLINIC_OR_DEPARTMENT_OTHER): Payer: Self-pay

## 2021-08-17 ENCOUNTER — Other Ambulatory Visit (HOSPITAL_COMMUNITY): Payer: Self-pay

## 2021-08-18 ENCOUNTER — Other Ambulatory Visit: Payer: Self-pay

## 2021-08-19 ENCOUNTER — Ambulatory Visit (INDEPENDENT_AMBULATORY_CARE_PROVIDER_SITE_OTHER): Payer: Self-pay | Admitting: Licensed Clinical Social Worker

## 2021-08-19 DIAGNOSIS — F331 Major depressive disorder, recurrent, moderate: Secondary | ICD-10-CM

## 2021-08-19 NOTE — Progress Notes (Signed)
? ?THERAPIST PROGRESS NOTE ? ? ?Virtual Visit via Video Note ? ?I connected with Jackie Holden on 08/19/21 at  9:00 AM EST by a video enabled telemedicine application and verified that I am speaking with the correct person using two identifiers. ? ?Location: ?Patient: Home ?Provider: Home ?  ?I discussed the limitations of evaluation and management by telemedicine and the availability of in person appointments. The patient expressed understanding and agreed to proceed. ?I discussed the assessment and treatment plan with the patient. The patient was provided an opportunity to ask questions and all were answered. The patient agreed with the plan and demonstrated an understanding of the instructions. ?  ?The patient was advised to call back or seek an in-person evaluation if the symptoms worsen or if the condition fails to improve as anticipated. ? ?I provided 40 minutes of non-face-to-face time during this encounter. ? ?Participation Level: Active ? ?Behavioral Response: CasualAlertDepressed ? ?Type of Therapy: Individual Therapy ? ?Treatment Goals addressed: dep/stressors/coping/exercise ? ?ProgressTowards Goals: Progressing ? ?Interventions: Solution Focused and Supportive ? ?Summary: Jackie Holden is a 61 y.o. female who presents with hx of MDD.  Today patient logs on for video session as this clinician is working from home today.  Patient reports she has not had significant changes since last session July 29, 2021.  LCSW assessed for whether or not patient called pain management doctor regarding meloxicam.  Chequita advises she did call shortly after last session and left a message but has not gotten a call back.  She agrees to place another call post session today.  Patient advises she had an adjustment made Monday of this week on the stimulator in her back.  She reports she cannot discern a difference in her pain since the adjustment and rates her pain today as a "7" on a scale of 0-10.  She  anticipates a call and possibly another adjustment next Monday.  LCSW assessed for additional falls which patient denies.  Egan reports she did not follow-up with her primary care provider and states she will put this back on her list of things to do since she continues to have balance issues.  Assessment of patient's feelings of depression reveal patient saying she "has good days and bad days".  She advises much of her feelings of depression are related to the severity of her pain.  LCSW provided education on situational and clinical depression.  Patient advises when she is more isolated and unable to get out of the home she feels more depressed.  Patient advises she is still trying to get out and walk on a regular basis, going to church and tries to be around people from her church as often as she is able even if she must do all of this in pain.  Patient reports it is painful for her to drive and she must often stay in as she is unable to drive.  She advises she uses [facility] transportation to get to appointments and would like to continue in person since it gets her out of the house.  LCSW provided referral to scat transportation so patient will have another transportation option for nonmedical appointments. Patient continues to rely heavily on her faith for coping.  LCSW provided education on the gratefulness project which patient reports she is willing to try. Pt deines other new worries/concerns this date. LCSW reviewed poc including scheduling prior to close of session. Pt states appreciation for care.  ? ?Suicidal/Homicidal: Nowithout intent/plan ? ?Therapist Response: Pt  receptive to care. ? ?Plan: Return again in ~4 weeks. ? ?Diagnosis: MDD, recurrent, moderate ? ?Collaboration of Care: Community Stakeholder(s) AEB Referral to [facility] ? ?Patient/Guardian was advised Release of Information must be obtained prior to any record release in order to collaborate their care with an outside provider. Patient/Guardian  was advised if they have not already done so to contact the registration department to sign all necessary forms in order for [place] to release information regarding their care.  ? ?Consent: Patient/Guardian gives verbal consent for treatment and assignment of benefits for services provided during this visit. Patient/Guardian expressed understanding and agreed to proceed.  ? ?[place] Sink, LCSW ?08/19/2021 ? ?

## 2021-09-08 ENCOUNTER — Ambulatory Visit (INDEPENDENT_AMBULATORY_CARE_PROVIDER_SITE_OTHER): Payer: Self-pay | Admitting: Psychiatry

## 2021-09-08 DIAGNOSIS — F32A Depression, unspecified: Secondary | ICD-10-CM

## 2021-09-08 DIAGNOSIS — F419 Anxiety disorder, unspecified: Secondary | ICD-10-CM | POA: Diagnosis not present

## 2021-09-08 NOTE — Progress Notes (Signed)
Psychiatric Initial Adult Assessment  ? ?Patient Identification: Jackie Holden ?MRN:  [id number] ?Date of Evaluation:  09/08/2021 ?Referral Source: therapist ? ?Virtual Visit via Telephone Note ? ?I connected with Jackie Holden on 09/08/21 at  8:30 AM EDT by telephone and verified that I am speaking with the correct person using two identifiers. ? ?Location: ?Patient: home ?Provider: off site ?  ?I discussed the limitations, risks, security and privacy concerns of performing an evaluation and management service by telephone and the availability of in person appointments. I also discussed with the patient that there may be a patient responsible charge related to this service. The patient expressed understanding and agreed to proceed. ? ?  ?I discussed the assessment and treatment plan with the patient. The patient was provided an opportunity to ask questions and all were answered. The patient agreed with the plan and demonstrated an understanding of the instructions. ?  ?The patient was advised to call back or seek an in-person evaluation if the symptoms worsen or if the condition fails to improve as anticipated. ? ?I provided 30 minutes of non-face-to-face time during this encounter. ? ? ?Mcneil Sober, NP  ? ?Chief Complaint:  initial psychiatric evaluation  ? ?Visit Diagnosis:  ?  ICD-10-CM   ?1. Anxiety and depression  F41.9   ? F32.A   ?  ? ? ?History of Present Illness:  Jackie Holden is a 61 year old female presenting to [facility] health outpatient for an initial psychiatric evaluation. She reports a psychiatric history of depression and anxiety. Patient denies a history of psychotropic medication use, but reports that she participates in psychotherapy for symptom management. .Patient reports a fear of medication because she has a history of experiencing adverse effects. Patient reported that she started to see objects moving when she was prescribed gabapentin and another medication  that she is unable to recall the name. She declines the option for medication management for symptoms and prefers to only continue therapy for symptom  management at this time. ? ?Patient reports depressive symptoms since 2019, ever since her injury on her job. Patient reports that she now experiences sciatica nerve pain and bursitis in her hip, and arthritis in neck, shoulder and hand. Patient reports that she can no longer workout in the gym or take walks of which she enjoyed. She reports decreased socialization or travel as she did previously. Patient reports that her pain is a major concern that causes emotional upset. ? ?Patient reports sleeping five hours nightly and feeling fatigued the next day. Patient reports that she experiences pain at night, causing "broken sleep" patterns. She reports excessively worrying about her pain. She reports "losing time" and being very forgetful at times.  ? ?Patient is alert and oriented x 4, calm, pleasant and willing to engage. She reports a depressed mood and good appetite. Patient denies SI/HI/AVH/paranoia or delusions. ? ?Associated Signs/Symptoms: ?Depression Symptoms:  depressed mood, ?anhedonia, ?insomnia, ?fatigue, ?difficulty concentrating, ?impaired memory, ?anxiety, ?loss of energy/fatigue, ?disturbed sleep, ?(Hypo) Manic Symptoms:   n/a ?Anxiety Symptoms:  Excessive Worry, ?Psychotic Symptoms:   n/a ?PTSD Symptoms: ?Negative ? ?Past Psychiatric History: anxiety and depression ? ?Previous Psychotropic Medications: No  ? ?Substance Abuse History in the last 12 months:  No. ? ?Consequences of Substance Abuse: ?Negative ? ?Past Medical History:  ?Past Medical History:  ?Diagnosis Date  ? Arthritis   ? Hypertension   ?  ?Past Surgical History:  ?Procedure Laterality Date  ? ABDOMINAL HYSTERECTOMY    ? ? ?  Family Psychiatric History: none known ? ?Family History:  ?Family History  ?Problem Relation Age of Onset  ? Diabetes Sister   ? Hypertension Sister   ? Diabetes  Brother   ? Hypertension Brother   ? Stroke Brother   ? Diabetes Brother   ? Hypertension Father   ? Hypertension Mother   ? ? ?Social History:  Patient is unemployed and has applied for disability income. Patient reported attending college for four years majoring in business, but did not graduate. Patient lives alone. ?Social History  ? ?Socioeconomic History  ? Marital status: Single  ?  Spouse name: Not on file  ? Number of children: Not on file  ? Years of education: Not on file  ? Highest education level: Not on file  ?Occupational History  ? Not on file  ?Tobacco Use  ? Smoking status: Never  ? Smokeless tobacco: Never  ?Substance and Sexual Activity  ? Alcohol use: Yes  ?  Comment: ocassionally  ? Drug use: Not on file  ? Sexual activity: Not on file  ?Other Topics Concern  ? Not on file  ?Social History Narrative  ? Not on file  ? ?Social Determinants of Health  ? ?Financial Resource Strain: Medium Risk  ? Difficulty of Paying Living Expenses: Somewhat hard  ?Food Insecurity: No Food Insecurity  ? Worried About Programme researcher, broadcasting/film/videounning Out of Food in the Last Year: Never true  ? Ran Out of Food in the Last Year: Never true  ?Transportation Needs: Unmet Transportation Needs  ? Lack of Transportation (Medical): Yes  ? Lack of Transportation (Non-Medical): Not on file  ?Physical Activity: Not on file  ?Stress: Stress Concern Present  ? Feeling of Stress : Very much  ?Social Connections: Not on file  ? ? ?Additional Social History: n/a ? ?Allergies:  No Known Allergies ? ?Metabolic Disorder Labs: ?Lab Results  ?Component Value Date  ? HGBA1C 5.3 12/01/2015  ? ?No results found for: PROLACTIN ?Lab Results  ?Component Value Date  ? CHOL 141 04/22/2021  ? TRIG 44 04/22/2021  ? HDL 32 (L) 04/22/2021  ? CHOLHDL 4.4 04/22/2021  ? VLDL 10 12/12/2009  ? LDLCALC 99 04/22/2021  ? LDLCALC 153 (H) 11/21/2018  ? ?No results found for: TSH ? ?Therapeutic Level Labs: ?No results found for: LITHIUM ?No results found for: CBMZ ?No results found  for: VALPROATE ? ?Current Medications: ?Current Outpatient Medications  ?Medication Sig Dispense Refill  ? atorvastatin (LIPITOR) 40 MG tablet Take 1 tablet (40 mg total) by mouth daily. 90 tablet 3  ? Cascara [place] 450 MG CAPS Take 1 each by mouth daily.    ? Cholecalciferol (VITAMIN D3) 125 MCG (5000 UT) CAPS Take 1 each by mouth daily.    ? COVID-19 mRNA bivalent vaccine, Pfizer, (PFIZER COVID-19 VAC BIVALENT) injection Inject into the muscle. 0.3 mL 0  ? dicyclomine (BENTYL) 10 MG capsule Take 10 mg by mouth 4 (four) times daily -  before meals and at bedtime.    ? dicyclomine (BENTYL) 10 MG capsule take 1 capsule by mouth 3 times daily as needed 270 capsule 1  ? hydrochlorothiazide (MICROZIDE) 12.5 MG capsule Take 1 capsule (12.5 mg total) by mouth daily. Please return to clinic Jan/Feb for lab work to check kidney function on this medication. 30 capsule 1  ? meloxicam (MOBIC) 15 MG tablet Take 1 tablet (15 mg total) by mouth daily. 30 tablet 2  ? pyridOXINE (VITAMIN B-6) 100 MG tablet Take 100 mg by mouth  daily.    ? vitamin B-12 (CYANOCOBALAMIN) 500 MCG tablet Take 500 mcg by mouth daily.    ? ?No current facility-administered medications for this visit.  ? ? ?Musculoskeletal: ?Strength & Muscle Tone:  n/a virtual visit ?Gait & Station:  n/a virtual visit ?Patient leans: N/A ? ?Psychiatric Specialty Exam: ?Review of Systems  ?Psychiatric/Behavioral:  Positive for dysphoric mood. Negative for hallucinations, self-injury and suicidal ideas.   ?All other systems reviewed and are negative.  ?There were no vitals taken for this visit.There is no height or weight on file to calculate BMI.  ?General Appearance: NA  ?Eye Contact:  NA  ?Speech:  Clear and Coherent  ?Volume:  Normal  ?Mood:  Depressed  ?Affect:  NA  ?Thought Process:  Goal Directed  ?Orientation:  Full (Time, Place, and Person)  ?Thought Content:  Logical  ?Suicidal Thoughts:  No  ?Homicidal Thoughts:  No  ?Memory:  Immediate;   Fair ?Recent;    Fair ?Remote;   Good  ?Judgement:  Good  ?Insight:  Good  ?Psychomotor Activity:  NA  ?Concentration:  Concentration: Good and Attention Span: Good  ?Recall:  Good  ?Fund of Knowledge:Good  ?Language: Good  ?Stephens Shire

## 2021-09-10 ENCOUNTER — Ambulatory Visit (HOSPITAL_COMMUNITY): Payer: No Payment, Other | Admitting: Physician Assistant

## 2021-09-18 ENCOUNTER — Other Ambulatory Visit: Payer: Self-pay | Admitting: Family Medicine

## 2021-09-18 ENCOUNTER — Telehealth (HOSPITAL_COMMUNITY): Payer: Self-pay | Admitting: Licensed Clinical Social Worker

## 2021-09-18 DIAGNOSIS — I10 Essential (primary) hypertension: Secondary | ICD-10-CM

## 2021-09-18 MED ORDER — HYDROCHLOROTHIAZIDE 12.5 MG PO CAPS
12.5000 mg | ORAL_CAPSULE | Freq: Every day | ORAL | 3 refills | Status: AC
  Filled 2021-09-18: qty 30, 30d supply, fill #0
  Filled 2021-10-21: qty 30, 30d supply, fill #1

## 2021-09-18 NOTE — Telephone Encounter (Signed)
LCSW placed phone call to pt to advise her of this clinician's resignation from [facility]. Provided information on transition plan to new counselor. All info left on vm as pt did not answer call. ?

## 2021-09-21 ENCOUNTER — Other Ambulatory Visit: Payer: Self-pay

## 2021-09-22 ENCOUNTER — Ambulatory Visit (HOSPITAL_COMMUNITY): Payer: No Payment, Other | Admitting: Licensed Clinical Social Worker

## 2021-10-21 ENCOUNTER — Other Ambulatory Visit: Payer: Self-pay

## 2021-10-22 ENCOUNTER — Other Ambulatory Visit: Payer: Self-pay

## 2021-10-26 ENCOUNTER — Other Ambulatory Visit: Payer: Self-pay

## 2021-10-29 ENCOUNTER — Ambulatory Visit (HOSPITAL_COMMUNITY): Payer: Self-pay | Admitting: Licensed Clinical Social Worker

## 2021-11-19 DIAGNOSIS — E785 Hyperlipidemia, unspecified: Secondary | ICD-10-CM | POA: Diagnosis not present

## 2021-11-19 DIAGNOSIS — M542 Cervicalgia: Secondary | ICD-10-CM | POA: Diagnosis not present

## 2021-11-19 DIAGNOSIS — M545 Low back pain, unspecified: Secondary | ICD-10-CM | POA: Diagnosis not present

## 2021-11-19 DIAGNOSIS — K5790 Diverticulosis of intestine, part unspecified, without perforation or abscess without bleeding: Secondary | ICD-10-CM | POA: Diagnosis not present

## 2021-11-19 DIAGNOSIS — F419 Anxiety disorder, unspecified: Secondary | ICD-10-CM | POA: Diagnosis not present

## 2021-11-19 DIAGNOSIS — K635 Polyp of colon: Secondary | ICD-10-CM | POA: Diagnosis not present

## 2021-11-19 DIAGNOSIS — I10 Essential (primary) hypertension: Secondary | ICD-10-CM | POA: Diagnosis not present

## 2021-11-19 DIAGNOSIS — F32A Depression, unspecified: Secondary | ICD-10-CM | POA: Diagnosis not present

## 2021-11-25 ENCOUNTER — Ambulatory Visit (HOSPITAL_COMMUNITY): Payer: No Payment, Other | Admitting: Licensed Clinical Social Worker

## 2021-12-03 DIAGNOSIS — I509 Heart failure, unspecified: Secondary | ICD-10-CM | POA: Diagnosis not present

## 2021-12-03 DIAGNOSIS — F419 Anxiety disorder, unspecified: Secondary | ICD-10-CM | POA: Diagnosis not present

## 2021-12-03 DIAGNOSIS — F32A Depression, unspecified: Secondary | ICD-10-CM | POA: Diagnosis not present

## 2021-12-03 DIAGNOSIS — E785 Hyperlipidemia, unspecified: Secondary | ICD-10-CM | POA: Diagnosis not present

## 2021-12-03 DIAGNOSIS — K589 Irritable bowel syndrome without diarrhea: Secondary | ICD-10-CM | POA: Diagnosis not present

## 2021-12-03 DIAGNOSIS — I10 Essential (primary) hypertension: Secondary | ICD-10-CM | POA: Diagnosis not present

## 2021-12-03 DIAGNOSIS — Z79899 Other long term (current) drug therapy: Secondary | ICD-10-CM | POA: Diagnosis not present

## 2021-12-03 DIAGNOSIS — M542 Cervicalgia: Secondary | ICD-10-CM | POA: Diagnosis not present

## 2021-12-16 DIAGNOSIS — Z1231 Encounter for screening mammogram for malignant neoplasm of breast: Secondary | ICD-10-CM | POA: Diagnosis not present

## 2022-04-07 LAB — COLOGUARD

## 2024-06-09 ENCOUNTER — Ambulatory Visit
Admission: EM | Admit: 2024-06-09 | Discharge: 2024-06-09 | Disposition: A | Discharge status: Home / Self Care | Attending: Family Medicine | Admitting: Family Medicine

## 2024-06-09 DIAGNOSIS — K047 Periapical abscess without sinus: Secondary | ICD-10-CM

## 2024-06-09 MED ORDER — AMOXICILLIN 875 MG PO TABS: 875.0000 mg | ORAL_TABLET | Freq: Two times a day (BID) | ORAL | 0 refills | Status: AC

## 2024-06-09 NOTE — ED Triage Notes (Signed)
 Pt reports dental pain x 1 day. Ibuprofen, slat water and ice gives no relief.

## 2024-06-09 NOTE — Discharge Instructions (Addendum)
 Start amoxicillin  for suspected developing dental infection. Reach out to your dentist on Monday.

## 2024-06-09 NOTE — ED Provider Notes (Signed)
 " [facility]  Note:  This document was prepared using Conservation officer, historic buildings and may include unintentional dictation errors.  MRN: [id number] DOB: [birth year]  Subjective:   Jackie Holden is a 63 y.o. female presenting for 1 day history of acute onset of left upper dental pain.  She has had significant dental issues and could not get in with her dentist urgently.  No fever, drainage of pus or bleeding.  Current Outpatient Medications  Medication Instructions   atorvastatin  (LIPITOR) 40 mg, Oral, Daily   Cascara [place] 450 MG CAPS 1 each, Oral, Daily   Cholecalciferol (VITAMIN D3) 125 MCG (5000 UT) CAPS 1 each, Oral, Daily   COVID-19 mRNA bivalent vaccine, Pfizer, (PFIZER COVID-19 VAC BIVALENT) injection Intramuscular   dicyclomine  (BENTYL ) 10 MG capsule take 1 capsule by mouth 3 times daily as needed   dicyclomine  (BENTYL ) 10 mg, Oral, 3 times daily before meals & bedtime   hydrochlorothiazide  (MICROZIDE ) 12.5 mg, Oral, Daily, Return summer/fall 2024 for recheck of kidney function.   ibuprofen (ADVIL) 200 mg, Every 6 hours PRN   meloxicam  (MOBIC ) 15 mg, Oral, Daily   omeprazole (PRILOSEC) 20 mg, Oral   pyridOXINE (VITAMIN B6) 100 mg, Oral, Daily   vitamin B-12 (CYANOCOBALAMIN) 500 mcg, Oral, Daily    Allergies[1]  Past Medical History:  Diagnosis Date   Arthritis    Hypertension      Past Surgical History:  Procedure Laterality Date   ABDOMINAL HYSTERECTOMY      Family History  Problem Relation Age of Onset   Diabetes Sister    Hypertension Sister    Diabetes Brother    Hypertension Brother    Stroke Brother    Diabetes Brother    Hypertension Father    Hypertension Mother     Social History   Occupational History   Not on file  Tobacco Use   Smoking status: Never   Smokeless tobacco: Never  Vaping Use   Vaping status: Never Used  Substance and Sexual Activity   Alcohol use: Yes    Comment: ocassionally   Drug use:  Never   Sexual activity: Not Currently     ROS   Objective:   Vitals: BP (!) 140/84 (BP Location: Right Arm)   Pulse 87   Temp 98.8 F (37.1 C) (Oral)   Resp 18   SpO2 98%   Physical Exam Constitutional:      General: She is not in acute distress.    Appearance: Normal appearance. She is well-developed. She is not ill-appearing, toxic-appearing or diaphoretic.  HENT:     Head: Normocephalic and atraumatic.     Nose: Nose normal.     Mouth/Throat:     Mouth: Mucous membranes are moist.     Dentition: Does not have dentures. Dental tenderness and dental caries present. No gingival swelling, dental abscesses or gum lesions.     Pharynx: No pharyngeal swelling, oropharyngeal exudate, posterior oropharyngeal erythema or uvula swelling.     Tonsils: No tonsillar exudate or tonsillar abscesses. 0 on the right. 0 on the left.   Eyes:     General: No scleral icterus.       Right eye: No discharge.        Left eye: No discharge.     Extraocular Movements: Extraocular movements intact.  Cardiovascular:     Rate and Rhythm: Normal rate.  Pulmonary:     Effort: Pulmonary effort is normal.  Skin:  General: Skin is warm and dry.  Neurological:     General: No focal deficit present.     Mental Status: She is alert and oriented to person, place, and time.  Psychiatric:        Mood and Affect: Mood normal.        Behavior: Behavior normal.     Assessment and Plan :   PDMP not reviewed this encounter.  1. Dental infection      Started amoxicillin .  Follow-up with her dentist to soon as possible.  Counseled patient on potential for adverse effects with medications prescribed/recommended today, ER and return-to-clinic precautions discussed, patient verbalized understanding.     [1] No Known Allergies    Christopher Savannah, PA-C 06/09/24 1237  "
